# Patient Record
Sex: Male | Born: 1955 | Race: White | Hispanic: No | Marital: Single | Smoking: Never smoker
Health system: Southern US, Community
[De-identification: ages and names within clinical notes are randomized; demographics above are authoritative.]

## PROBLEM LIST (undated history)

## (undated) DIAGNOSIS — N472 Paraphimosis: Secondary | ICD-10-CM

## (undated) DIAGNOSIS — I10 Essential (primary) hypertension: Secondary | ICD-10-CM

## (undated) DIAGNOSIS — K219 Gastro-esophageal reflux disease without esophagitis: Secondary | ICD-10-CM

## (undated) DIAGNOSIS — R972 Elevated prostate specific antigen [PSA]: Secondary | ICD-10-CM

## (undated) DIAGNOSIS — N400 Enlarged prostate without lower urinary tract symptoms: Secondary | ICD-10-CM

## (undated) DIAGNOSIS — R339 Retention of urine, unspecified: Secondary | ICD-10-CM

## (undated) HISTORY — DX: Elevated prostate specific antigen (PSA): R97.20

## (undated) HISTORY — PX: OTHER SURGICAL HISTORY: SHX169

## (undated) HISTORY — DX: Gastro-esophageal reflux disease without esophagitis: K21.9

## (undated) HISTORY — DX: Benign prostatic hyperplasia without lower urinary tract symptoms: N40.0

## (undated) HISTORY — PX: COLONOSCOPY: SHX174

## (undated) HISTORY — DX: Paraphimosis: N47.2

## (undated) HISTORY — DX: Essential (primary) hypertension: I10

## (undated) HISTORY — PX: UPPER GI ENDOSCOPY: SHX6162

## (undated) HISTORY — DX: Retention of urine, unspecified: R33.9

---

## 1981-02-10 HISTORY — PX: WISDOM TOOTH EXTRACTION: SHX21

## 1999-07-09 ENCOUNTER — Emergency Department (HOSPITAL_COMMUNITY): Admission: EM | Admit: 1999-07-09 | Discharge: 1999-07-09 | Payer: Self-pay | Admitting: Emergency Medicine

## 2001-08-05 ENCOUNTER — Encounter: Payer: Self-pay | Admitting: Family Medicine

## 2001-08-05 ENCOUNTER — Encounter: Admission: RE | Admit: 2001-08-05 | Discharge: 2001-08-05 | Payer: Self-pay | Admitting: Family Medicine

## 2001-08-09 ENCOUNTER — Encounter: Payer: Self-pay | Admitting: Family Medicine

## 2001-08-09 ENCOUNTER — Encounter: Admission: RE | Admit: 2001-08-09 | Discharge: 2001-08-09 | Payer: Self-pay | Admitting: Family Medicine

## 2003-07-03 ENCOUNTER — Inpatient Hospital Stay (HOSPITAL_COMMUNITY): Admission: AD | Admit: 2003-07-03 | Discharge: 2003-07-04 | Payer: Self-pay | Admitting: Internal Medicine

## 2007-06-15 ENCOUNTER — Encounter: Admission: RE | Admit: 2007-06-15 | Discharge: 2007-06-15 | Payer: Self-pay | Admitting: Gastroenterology

## 2010-06-28 NOTE — Op Note (Signed)
NAMETABER, SWEETSER                         ACCOUNT NO.:  [id number]   MEDICAL RECORD NO.:  [id number]                   PATIENT TYPE:  INP   LOCATION:  6714                                 FACILITY:  [facility]   PHYSICIAN:  Abigail Miyamoto, M.D.              DATE OF BIRTH:  07/27/1955   DATE OF PROCEDURE:  07/03/2003  DATE OF DISCHARGE:                                 OPERATIVE REPORT   PREOPERATIVE DIAGNOSIS:  Left lower extremity abscess.   POSTOPERATIVE DIAGNOSIS:  Left lower extremity abscess.   PROCEDURE:  Incision and drainage of left lower leg abscess.   SURGEON:  Abigail Miyamoto, M.D.   ANESTHESIA:  Lidocaine 1%.   ESTIMATED BLOOD LOSS:  Minimal.   INDICATIONS FOR PROCEDURE:  The patient is a young gentleman with an abscess  on his left lower extremity that is pertinent, secondary to a spider bite.  The wound is draining purulent material, therefore an incision and drainage  has been recommended.   DESCRIPTION OF PROCEDURE:  The patient was already in the hospital room in  the bed in the supine position.  His left leg was then prepped and draped on  the lateral side under normal technique.  There was a large necrotic area of  skin measuring approximately 30 cm in diameter, and he had a large amount of  cellulitis around this.  At this point the skin was prepped with Betadine  and anesthetized with 1% Lidocaine.  An ellipse of skin was then excised,  removing the necrotic skin and underlying necrotic fat.  Further necrotic  debris in the wound was then excised likewise with a #11 blade.  The wound  was then thoroughly irrigated with normal saline.  The underlying muscle  appeared viable.  At this point the wound was packed with a wet to dry  saline gauze.  Dry gauze was then placed over top of this.  The patient tolerated the procedure well.  He was then left in his hospital  room.                                               Abigail Miyamoto, M.D.    DB/MEDQ   D:  07/03/2003  T:  07/04/2003  Job:  [id number]

## 2010-06-28 NOTE — Consult Note (Signed)
Calvin [name], Calvin [name]                         ACCOUNT NO.:  [id number]   MEDICAL RECORD NO.:  [id number]                   PATIENT TYPE:  INP   LOCATION:  6714                                 FACILITY:  [facility]   PHYSICIAN:  Abigail Miyamoto, M.D.              DATE OF BIRTH:  1955-10-07   DATE OF CONSULTATION:  07/03/2003  DATE OF DISCHARGE:  07/04/2003                                   CONSULTATION   CHIEF COMPLAINT:  Left leg abscess.   HISTORY OF PRESENT ILLNESS:  This very pleasant gentleman who works at the  [employer] who has no prior medical history presented last week to his  primary care physician with a reddened area on his left lower extremity.  It  has since progressed to develop into cellulitis and a draining abscess that  was I&D'd by his primary care physician.  He is being admitted to the  hospital fur further IV antibiotic __________.  I have been consulted to  follow the wound and perform further incision and drainage.  The patient  reports he is having some pain in his left lower extremity but is otherwise  without complaint.  He has no numbness or tingling in the foot.  Again he is  not a diabetic and has no previous past medical history.   ALLERGIES:  He is allergic to PENICILLIN.   HABITS:  He also does not smoke.   PHYSICAL EXAMINATION:  VITAL SIGNS:  The patient is currently afebrile.  His  vital signs are stable.  EXTREMITIES:  Physical examination was limited to the left lower extremity.  He has a large, approximately 3 cm necrotic area on the lateral aspect of  the left lower extremity below the knee.  There is surrounding induration  and a large amount of cellulitis and swelling.  The small wound in the  middle of the necrotic skin is draining purulent material.   IMPRESSION/PLAN:  This is a patient with a left lower extremity abscess.  At  this point incision and drainage will be performed at bedside.  We will then  start wet to dry normal saline  dressing changes.  I agree with IV  antibiotics and leg elevation and we will follow the wound with you  following my incision and drainage.                                               Abigail Miyamoto, M.D.    DB/MEDQ  D:  07/03/2003  T:  07/04/2003  Job:  [id number]

## 2010-06-28 NOTE — H&P (Signed)
Calvin [name], Calvin [name]                         ACCOUNT NO.:  [id number]   MEDICAL RECORD NO.:  [id number]                   PATIENT TYPE:  INP   LOCATION:  6714                                 FACILITY:  [facility]   PHYSICIAN:  Melissa L. Ladona Ridgel, MD               DATE OF BIRTH:  [birth year]   DATE OF ADMISSION:  07/03/2003  DATE OF DISCHARGE:                                HISTORY & PHYSICAL   PRIMARY CARE PHYSICIAN:  Vikki Ports, M.D.   CHIEF COMPLAINT:  Painful leg wound.   HISTORY OF PRESENT ILLNESS:  This is a 55 year old white male who awoke on  Tuesday of this week with a small red spot on his left leg.  He paid no  attention to this wound until Thursday when he struck his leg on an object  and noticed that it was increasingly painful and red.  He made an  appointment to see Dr. Chales Salmon. Thacker on Friday, and was started on  Levaquin.  The area around his initial cellulitis was marked with a pen.  By  the following day, the erythema had extended well beyond the margins of the  pen markings, and the wound itself opened up ventrally, expressing large  amounts of pus.  The patient reports associated fevers and chills earlier in  the week; however, at present denies any.  [facility], and the wound was further I&D'd, and  specimens were sent to the laboratory for evaluation.  The wound continued  to become worse overnight, and he is therefore being directly admitted to  the hospital for IV antibiotic therapy and further debridement.   REVIEW OF SYSTEMS:  As stated, initially fever and chills earlier in the  week.  At present no nausea, vomiting, diarrhea, fever, or chills.  No  cough, chest pain, abdominal pain, melena, or hematochezia.  No dysuria, no  fall.  Other review of systems negative.   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  He does  not smoke.  He does occasionally drink alcohol.  He  denies any IV drug use  or illicit drug use.   FAMILY HISTORY:  His mom is deceased, secondary to heart failure and lung  disease.  His father is living, but he is not aware of his medical  conditions.   ALLERGIES:  PENICILLIN which causes a rash.   CURRENT MEDICATIONS:  Levaquin, unknown dose at this time.  No other  medications.   PHYSICAL EXAMINATION:  VITAL SIGNS:  On admission temperature 98.4 degrees,  blood pressure 118/82, heart rate 66, respirations 18.  Saturations 99%.  GENERAL:  This is a well-nourished, well-developed white male, in no acute  distress.  HEENT:  He is normocephalic, atraumatic.  Pupils equal, round, reactive to  light.  Extraocular muscles intact.  Mucous membranes are moist.  NECK:  Supple.  No jugular venous distention.  No lymph nodes.  No bruits.  CHEST:  Is clear to auscultation with no rhonchi, rales, or wheezes.  CARDIOVASCULAR:  A regular rate and rhythm.  Positive S1, S2.  No S3, no S4.  No murmurs, rubs, or gallops.  ABDOMEN:  Soft, nontender, nondistended.  Positive bowel sounds.  No  hepatosplenomegaly.  EXTREMITIES:  A left leg wound with extensive cellulitis surrounding a 3.0  to 4.0 cm diameter lesion.  It shows purulence and some hemorrhage.  NEUROLOGIC:  He is nonfocal.  Power is 5/5.  Sensation is intact.  Deep  tendon reflexes 2+.   LABORATORY DATA:  Pending.   ASSESSMENT:  This is a 55 year old white male with a leg wound, which has  developed into an abscess.  In retrospect the patient was cleaning out his  shed on Monday, the day before the initial red spot appeared.  He states  that he may have been bitten by a brown recluse spider.   PLAN:  1. Infectious disease:  The patient will be started on vancomycin and     moxifloxacin, as he has a penicillin allergy.  Surgery has been contacted     to debride the wound and send cultures for further followup.  We will     also check blood cultures.  2. We will decide on placement of  the PIC line in the morning, depending     upon the findings on the laboratory work and his initial cultures which     had been sent on Sunday from the outpatient clinic.  3. We will obtain cultures from the [facility] that were completed     as an outpatient.  4. We will consider an infectious disease consultation, depending upon the     hospital course and the organism that we find is involved.  They will be     able to assist in determining whether home therapy needs to be IV or p.o.  5. GI:  The patient will be maintained on a regular diet.  I do not believe     he needs GI prophylaxis at this time.  6. Deep vein thrombosis prophylaxis:  The patient can ambulate as tolerated.     Should he show further signs of leg pain, however, investigation for a     deep vein thrombosis should be performed, as he probably has not been as     active as he would have been on this leg.  At this time there is no     suggestion for a deep vein thrombosis, and he is able to ambulate.                                                Melissa L. Ladona Ridgel, MD    MLT/MEDQ  D:  07/03/2003  T:  07/03/2003  Job:  [id number]   cc:   Vikki Ports, M.D.  [address]  [place]  [place] [address]  Fax: [phone]

## 2014-02-01 ENCOUNTER — Encounter: Payer: Self-pay | Admitting: Cardiology

## 2014-02-01 ENCOUNTER — Ambulatory Visit (INDEPENDENT_AMBULATORY_CARE_PROVIDER_SITE_OTHER): Payer: Federal, State, Local not specified - PPO | Admitting: Cardiology

## 2014-02-01 VITALS — BP 140/90 | HR 68 | Ht 72.0 in | Wt 204.7 lb

## 2014-02-01 DIAGNOSIS — I4891 Unspecified atrial fibrillation: Secondary | ICD-10-CM

## 2014-02-01 DIAGNOSIS — R5383 Other fatigue: Secondary | ICD-10-CM

## 2014-02-01 NOTE — Patient Instructions (Signed)
Your physician recommends that you schedule a follow-up appointment in: one year with Dr. Hochrein  

## 2014-02-01 NOTE — Progress Notes (Signed)
HPI The patient presents for evaluation and screening for possible heart disease. He has had no history of heart disease and doesn't report any overt cardiovascular symptoms. He has had some fatigue but it depends he says, he's been working. 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  He walks 2 hours several days a week and he has no symptoms related to this. The patient denies any new symptoms such as chest discomfort, neck or arm discomfort. There has been no new shortness of breath, PND or orthopnea. There have been no reported palpitations, presyncope or syncope.  Allergies  Allergen Reactions  . Penicillins Rash    Current Outpatient Prescriptions  Medication Sig Dispense Refill  . FLUVIRIN 0.5 ML SUSY   0   No current facility-administered medications for this visit.    Past Medical History  Diagnosis Date  . GERD (gastroesophageal reflux disease)     No past surgical history on file.  Family History  Problem Relation Age of Onset  . Heart failure Mother     History   Social History  . Marital Status: Single    Spouse Name: N/A    Number of Children: N/A  . Years of Education: N/A   Occupational History  . Not on file.   Social History Main Topics  . Smoking status: Never Smoker   . Smokeless tobacco: Not on file  . Alcohol Use: No  . Drug Use: No  . Sexual Activity: Not on file   Other Topics Concern  . Not on file   Social History Narrative   Tobacco use cigarettes Never smoked   Alcohol yes: rare  - 1week ago or so    Caffeine : yes    No recreational  Drug use    Occupation employed , Radiographer, [employer]   Marital Status : single   Children : none    ROS:  As stated in the HPI and negative for all other systems.  PHYSICAL EXAM BP 140/90 mmHg  Pulse 68  Ht 6' (1.829 m)  Wt 204 lb 11.2 oz (92.851 kg)  BMI 27.76 kg/m2  GENERAL:  Well appearing HEENT:  Pupils equal round and reactive, fundi not visualized, oral mucosa  unremarkable NECK:  No jugular venous distention, waveform within normal limits, carotid upstroke brisk and symmetric, no bruits, no thyromegaly LYMPHATICS:  No cervical, inguinal adenopathy LUNGS:  Clear to auscultation bilaterally BACK:  No CVA tenderness CHEST:  Unremarkable HEART:  PMI not displaced or sustained,S1 and S2 within normal limits, no S3, no S4, no clicks, no rubs, no murmurs ABD:  Flat, positive bowel sounds normal in frequency in pitch, no bruits, no rebound, no guarding, no midline pulsatile mass, no hepatomegaly, no splenomegaly EXT:  2 plus pulses throughout, no edema, no cyanosis no clubbing SKIN:  No rashes no nodules NEURO:  Cranial nerves II through XII grossly intact, motor grossly intact throughout PSYCH:  Cognitively intact, oriented to person place and time   EKG:   Sinus rhythm, rate 68, axis within normal limits, intervals within normal limits, no acute ST T wave changes.  02/01/2014   ASSESSMENT AND PLAN   FATIGUE:  I don't suspect a cardiac etiology. Patient has no significant risk factors and no findings on exam. His known history consistent with cardiac etiology. He exercises routinely. Given this the pretest probability of obstructive coronary disease or structural heart disease is low. No further cardiovascular testing is suggested.  HTN:  His blood  pressure is borderline. However, he does not routinely elevated. He can keep an eye on this. No change in therapy is planned.

## 2014-10-06 ENCOUNTER — Other Ambulatory Visit: Payer: Self-pay | Admitting: Family Medicine

## 2014-10-06 ENCOUNTER — Ambulatory Visit
Admission: RE | Admit: 2014-10-06 | Discharge: 2014-10-06 | Disposition: A | Discharge status: Home / Self Care | Payer: Federal, State, Local not specified - PPO | Source: Ambulatory Visit | Attending: Family Medicine | Admitting: Family Medicine

## 2014-10-06 DIAGNOSIS — M25572 Pain in left ankle and joints of left foot: Secondary | ICD-10-CM

## 2016-08-19 DIAGNOSIS — R310 Gross hematuria: Secondary | ICD-10-CM | POA: Diagnosis not present

## 2016-08-19 DIAGNOSIS — R350 Frequency of micturition: Secondary | ICD-10-CM | POA: Diagnosis not present

## 2016-08-19 DIAGNOSIS — N401 Enlarged prostate with lower urinary tract symptoms: Secondary | ICD-10-CM | POA: Diagnosis not present

## 2016-08-20 DIAGNOSIS — N281 Cyst of kidney, acquired: Secondary | ICD-10-CM | POA: Diagnosis not present

## 2016-08-20 DIAGNOSIS — R310 Gross hematuria: Secondary | ICD-10-CM | POA: Diagnosis not present

## 2016-09-08 DIAGNOSIS — R351 Nocturia: Secondary | ICD-10-CM | POA: Diagnosis not present

## 2016-09-08 DIAGNOSIS — R972 Elevated prostate specific antigen [PSA]: Secondary | ICD-10-CM | POA: Diagnosis not present

## 2016-09-08 DIAGNOSIS — N401 Enlarged prostate with lower urinary tract symptoms: Secondary | ICD-10-CM | POA: Diagnosis not present

## 2016-09-08 DIAGNOSIS — R310 Gross hematuria: Secondary | ICD-10-CM | POA: Diagnosis not present

## 2016-12-09 DIAGNOSIS — R972 Elevated prostate specific antigen [PSA]: Secondary | ICD-10-CM | POA: Diagnosis not present

## 2016-12-17 DIAGNOSIS — R310 Gross hematuria: Secondary | ICD-10-CM | POA: Diagnosis not present

## 2016-12-17 DIAGNOSIS — R972 Elevated prostate specific antigen [PSA]: Secondary | ICD-10-CM | POA: Diagnosis not present

## 2017-02-26 DIAGNOSIS — R030 Elevated blood-pressure reading, without diagnosis of hypertension: Secondary | ICD-10-CM | POA: Diagnosis not present

## 2017-02-26 DIAGNOSIS — J209 Acute bronchitis, unspecified: Secondary | ICD-10-CM | POA: Diagnosis not present

## 2017-02-26 DIAGNOSIS — R0989 Other specified symptoms and signs involving the circulatory and respiratory systems: Secondary | ICD-10-CM | POA: Diagnosis not present

## 2017-02-28 ENCOUNTER — Other Ambulatory Visit: Payer: Self-pay | Admitting: Family Medicine

## 2017-02-28 DIAGNOSIS — R9389 Abnormal findings on diagnostic imaging of other specified body structures: Secondary | ICD-10-CM

## 2017-03-06 ENCOUNTER — Ambulatory Visit
Admission: RE | Admit: 2017-03-06 | Discharge: 2017-03-06 | Disposition: A | Discharge status: Home / Self Care | Payer: Federal, State, Local not specified - PPO | Source: Ambulatory Visit | Attending: Family Medicine | Admitting: Family Medicine

## 2017-03-06 DIAGNOSIS — R918 Other nonspecific abnormal finding of lung field: Secondary | ICD-10-CM | POA: Diagnosis not present

## 2017-03-06 DIAGNOSIS — R9389 Abnormal findings on diagnostic imaging of other specified body structures: Secondary | ICD-10-CM

## 2017-03-06 MED ORDER — IOPAMIDOL (ISOVUE-300) INJECTION 61%
75.0000 mL | Freq: Once | INTRAVENOUS | Status: AC | PRN
  Administered 2017-03-06: 75 mL via INTRAVENOUS

## 2017-03-13 DIAGNOSIS — R0989 Other specified symptoms and signs involving the circulatory and respiratory systems: Secondary | ICD-10-CM | POA: Diagnosis not present

## 2017-03-13 DIAGNOSIS — J209 Acute bronchitis, unspecified: Secondary | ICD-10-CM | POA: Diagnosis not present

## 2017-03-13 DIAGNOSIS — R030 Elevated blood-pressure reading, without diagnosis of hypertension: Secondary | ICD-10-CM | POA: Diagnosis not present

## 2017-06-09 DIAGNOSIS — Z13220 Encounter for screening for lipoid disorders: Secondary | ICD-10-CM | POA: Diagnosis not present

## 2017-06-09 DIAGNOSIS — Z0000 Encounter for general adult medical examination without abnormal findings: Secondary | ICD-10-CM | POA: Diagnosis not present

## 2017-06-10 DIAGNOSIS — R972 Elevated prostate specific antigen [PSA]: Secondary | ICD-10-CM | POA: Diagnosis not present

## 2017-06-17 DIAGNOSIS — N400 Enlarged prostate without lower urinary tract symptoms: Secondary | ICD-10-CM | POA: Diagnosis not present

## 2017-06-17 DIAGNOSIS — R972 Elevated prostate specific antigen [PSA]: Secondary | ICD-10-CM | POA: Diagnosis not present

## 2017-06-17 DIAGNOSIS — R310 Gross hematuria: Secondary | ICD-10-CM | POA: Diagnosis not present

## 2017-07-16 DIAGNOSIS — R6883 Chills (without fever): Secondary | ICD-10-CM | POA: Diagnosis not present

## 2017-07-16 DIAGNOSIS — J209 Acute bronchitis, unspecified: Secondary | ICD-10-CM | POA: Diagnosis not present

## 2017-07-30 DIAGNOSIS — R05 Cough: Secondary | ICD-10-CM | POA: Diagnosis not present

## 2017-07-30 DIAGNOSIS — R0989 Other specified symptoms and signs involving the circulatory and respiratory systems: Secondary | ICD-10-CM | POA: Diagnosis not present

## 2017-08-12 DIAGNOSIS — R05 Cough: Secondary | ICD-10-CM | POA: Diagnosis not present

## 2017-08-12 DIAGNOSIS — R062 Wheezing: Secondary | ICD-10-CM | POA: Diagnosis not present

## 2017-08-12 DIAGNOSIS — K219 Gastro-esophageal reflux disease without esophagitis: Secondary | ICD-10-CM | POA: Diagnosis not present

## 2018-09-17 IMAGING — CT CT CHEST W/ CM
2 of 3 series · 16 of 30 positions shown, 19 images · IV contrast (75CC ISOVUE 300)
Comparison: Radiographs dated 12/27/2017

CLINICAL DATA: Abnormal chest x-ray dated 02/26/2017. Sclerotic
right sixth rib.

Creatinine was obtained on site at [HOSPITAL] at [REDACTED].
Results: Creatinine 0.9 mg/dL.
EXAM:
CT CHEST WITH CONTRAST
TECHNIQUE: Multidetector CT imaging of the chest was performed during
intravenous contrast administration.
CONTRAST:  75mL 5Z5QPD-Q33 IOPAMIDOL (5Z5QPD-Q33) INJECTION 61%

[Series 3: chest with · axial · 0.77mm/px · z∈[-222,+26]mm · 10 of 123 slices shown, 13 images]
[im 12/123  mediastinal]
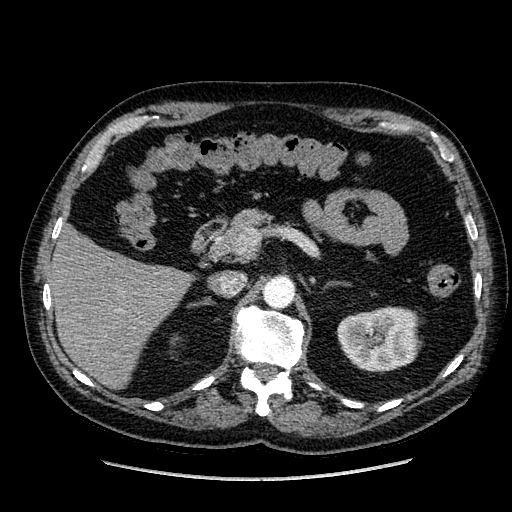
[im 12/123  lung]
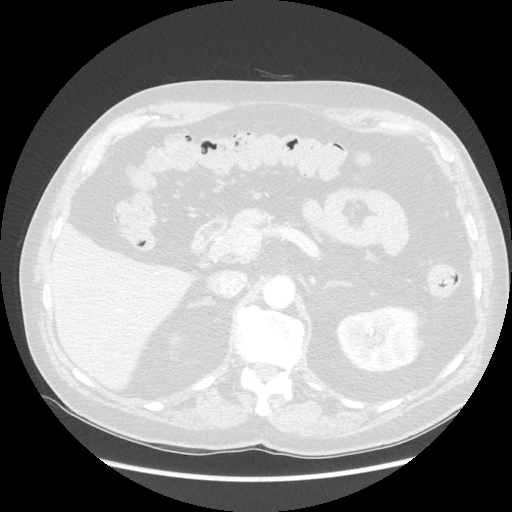
[im 23/123  lung]
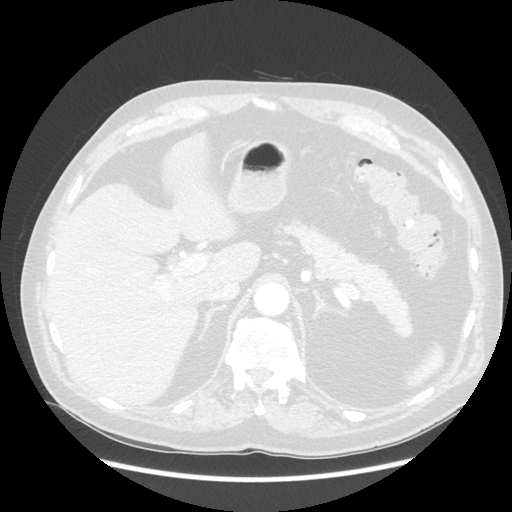
[im 34/123  lung]
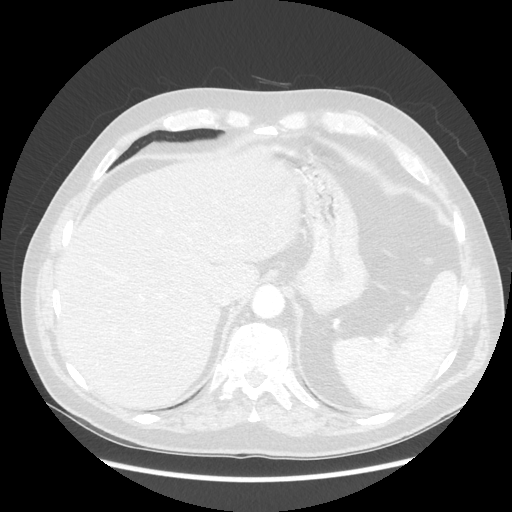
[im 45/123  lung]
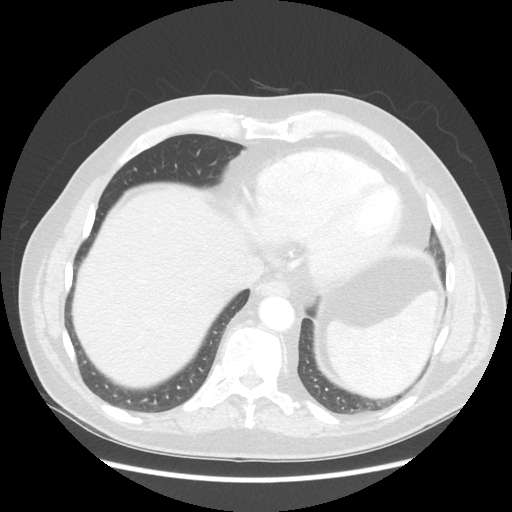
[im 56/123  mediastinal]
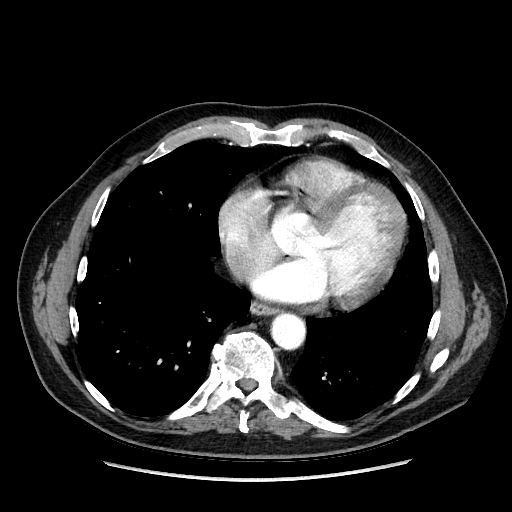
[im 56/123  lung]
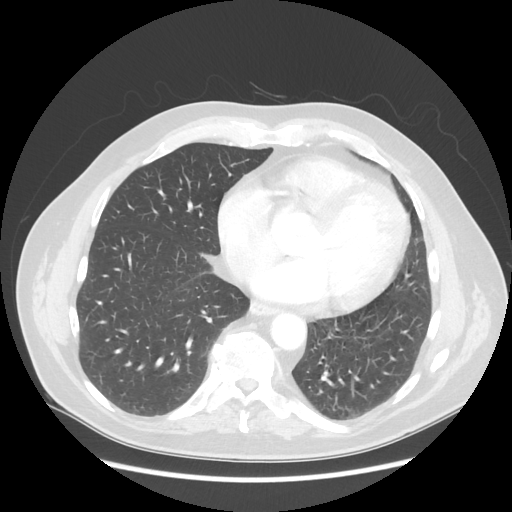
[im 67/123  lung]
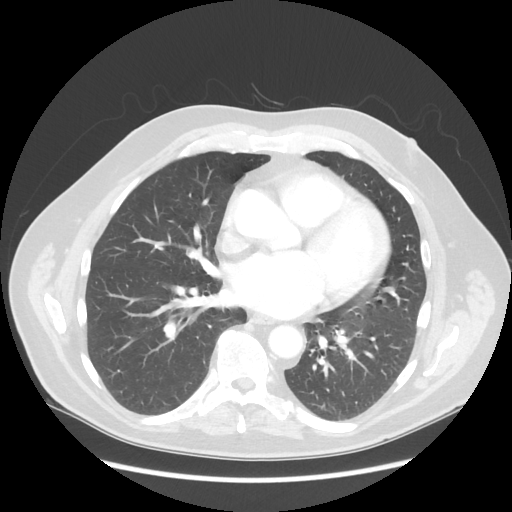
[im 78/123  lung]
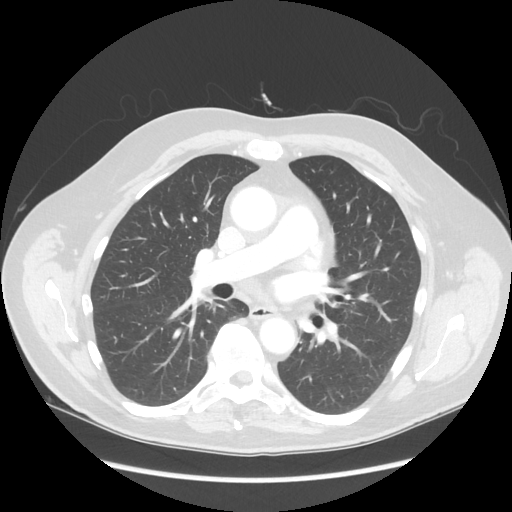
[im 89/123  lung]
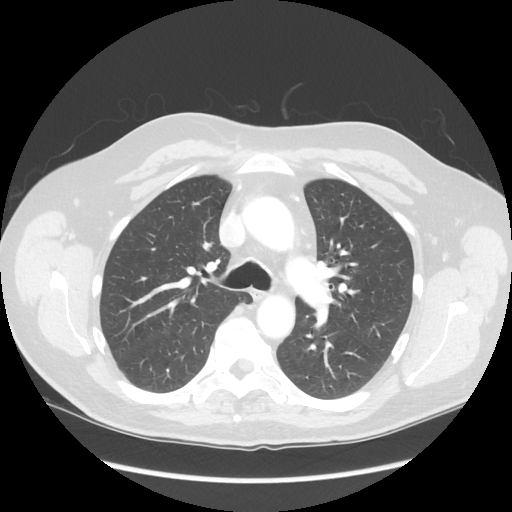
[im 100/123  mediastinal]
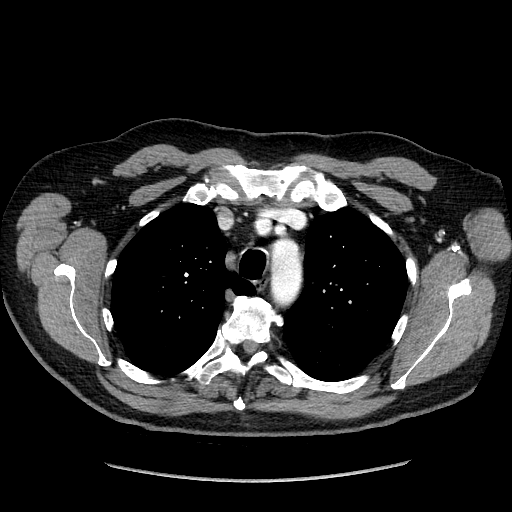
[im 100/123  lung]
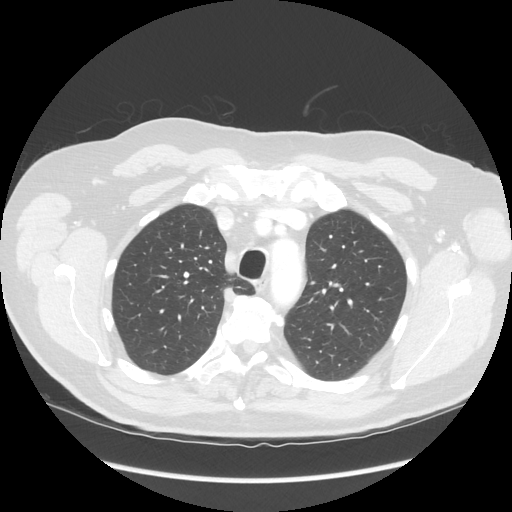
[im 111/123  lung]
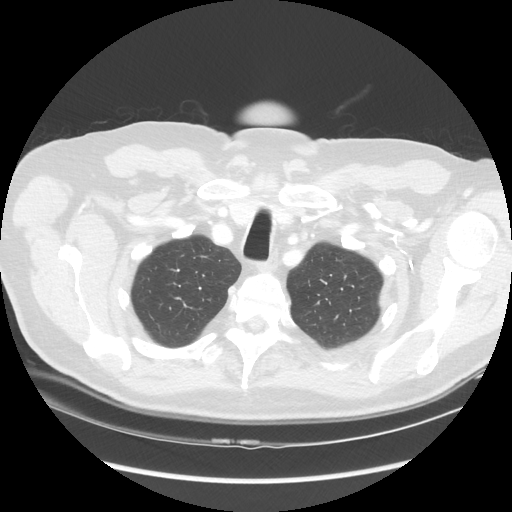

[Series 602: sagittal body · sagittal · 0.77mm/px · 6 of 158 slices shown]
[im 11/158  mediastinal]
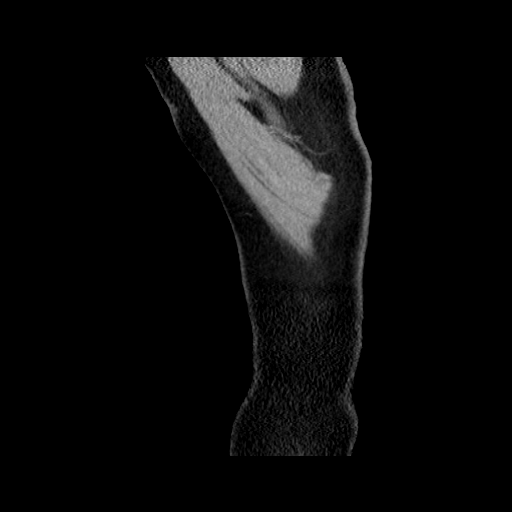
[im 32/158  mediastinal]
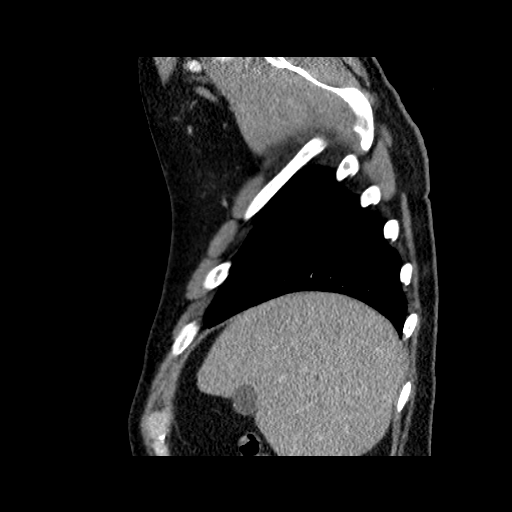
[im 53/158  mediastinal]
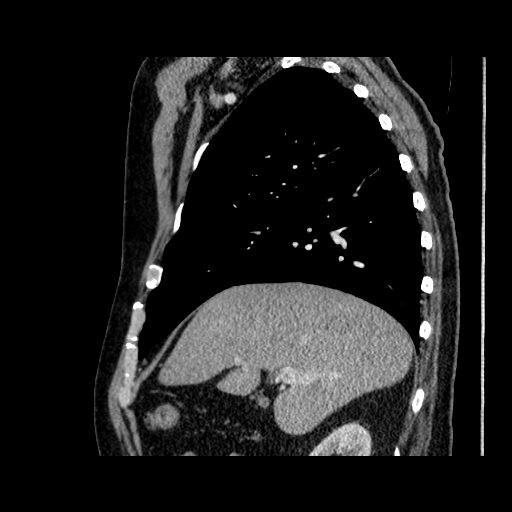
[im 74/158  mediastinal]
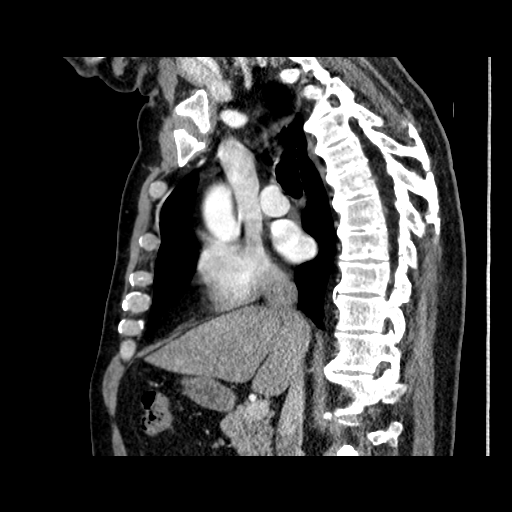
[im 84/158  mediastinal]
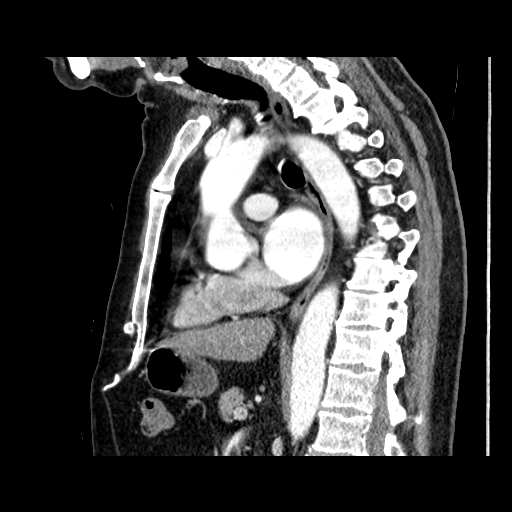
[im 105/158  mediastinal]
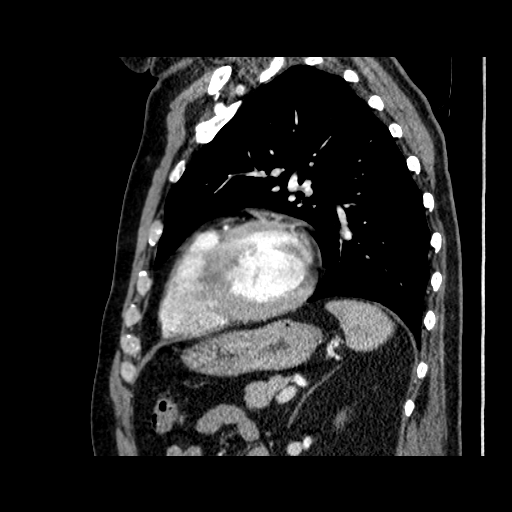

[16 of 30 positions shown; findings below may reference images not displayed]

FINDINGS: Cardiovascular: No significant vascular findings. Normal heart size.
No pericardial effusion.

Mediastinum/Nodes: No enlarged mediastinal, hilar, or axillary lymph
nodes. Thyroid gland, trachea, and esophagus demonstrate no
significant findings.

Lungs/Pleura: Lungs are clear. No pleural effusion or pneumothorax.

Upper Abdomen: Normal except for an 18 mm exophytic cyst on the
posterior aspect of the mid left kidney.

Musculoskeletal: There is a subtle area minimal sclerosis of the
lateral aspect of the right sixth rib which correlates with the
appearance on chest x-ray. There is a similar appearance of the
opposite sixth rib. This is felt to be developmental rather than
representing a significant sclerotic bone lesion.

Small osteophytes fuse much of the thoracic spine. Mild thoracic
scoliosis.
IMPRESSION: No significant abnormalities. The slight sclerotic appearance of the
anterolateral aspect of the right sixth rib is felt to be a benign
developmental abnormality. The left sixth rib has a similar
appearance.

## 2019-04-10 ENCOUNTER — Ambulatory Visit: Payer: Federal, State, Local not specified - PPO | Attending: Internal Medicine

## 2019-04-10 DIAGNOSIS — Z23 Encounter for immunization: Secondary | ICD-10-CM

## 2019-04-10 NOTE — Progress Notes (Signed)
   Covid-19 Vaccination Clinic  Name:  Calvin [name]    MRN: [id number] DOB: September 08, 1955  04/10/2019  Mr. [name] was observed post Covid-19 immunization for 15 minutes without incidence. He was provided with Vaccine Information Sheet and instruction to access the V-Safe system.   Mr. [name] was instructed to call 911 with any severe reactions post vaccine: Marland Kitchen Difficulty breathing  . Swelling of your face and throat  . A fast heartbeat  . A bad rash all over your body  . Dizziness and weakness    Immunizations Administered    Name Date Dose VIS Date Route   Pfizer COVID-19 Vaccine 04/10/2019  8:34 AM 0.3 mL 01/21/2019 Intramuscular   Manufacturer: [employer], [employer]   Lot: [id number]   NDC: [address]-2

## 2019-05-04 ENCOUNTER — Ambulatory Visit: Payer: Federal, State, Local not specified - PPO | Attending: Internal Medicine

## 2019-05-04 DIAGNOSIS — Z23 Encounter for immunization: Secondary | ICD-10-CM

## 2019-05-04 NOTE — Progress Notes (Signed)
   Covid-19 Vaccination Clinic  Name:  Calvin [name]    MRN: [id number] DOB: 01-Jul-1955  05/04/2019  [name] was observed post Covid-19 immunization for 15 minutes without incident. He was provided with Vaccine Information Sheet and instruction to access the V-Safe system.   [name] was instructed to call 911 with any severe reactions post vaccine: Marland Kitchen Difficulty breathing  . Swelling of face and throat  . A fast heartbeat  . A bad rash all over body  . Dizziness and weakness   Immunizations Administered    Name Date Dose VIS Date Route   Pfizer COVID-19 Vaccine 05/04/2019  9:03 AM 0.3 mL 01/21/2019 Intramuscular   Manufacturer: [employer], [employer]   Lot: [id number]   NDC: [address]-1

## 2021-02-10 HISTORY — PX: PROSTATE SURGERY: SHX751

## 2021-06-14 HISTORY — PX: CYSTOSCOPY: SUR368

## 2021-07-11 DIAGNOSIS — N401 Enlarged prostate with lower urinary tract symptoms: Secondary | ICD-10-CM

## 2021-07-11 DIAGNOSIS — N138 Other obstructive and reflux uropathy: Secondary | ICD-10-CM

## 2021-07-11 HISTORY — DX: Other obstructive and reflux uropathy: N13.8

## 2021-07-11 HISTORY — DX: Benign prostatic hyperplasia with lower urinary tract symptoms: N40.1

## 2021-07-24 ENCOUNTER — Encounter: Payer: Self-pay | Admitting: *Deleted

## 2021-07-30 ENCOUNTER — Other Ambulatory Visit
Admission: RE | Admit: 2021-07-30 | Discharge: 2021-07-30 | Disposition: A | Discharge status: Home / Self Care | Payer: Medicare Other | Source: Ambulatory Visit | Attending: Urology | Admitting: Urology

## 2021-07-30 ENCOUNTER — Encounter: Payer: Self-pay | Admitting: Urology

## 2021-07-30 ENCOUNTER — Other Ambulatory Visit: Payer: Self-pay | Admitting: Urology

## 2021-07-30 ENCOUNTER — Ambulatory Visit: Payer: Medicare Other | Admitting: Urology

## 2021-07-30 ENCOUNTER — Telehealth: Payer: Self-pay

## 2021-07-30 VITALS — BP 155/97 | HR 108 | Ht 72.0 in | Wt 199.4 lb

## 2021-07-30 DIAGNOSIS — Z01818 Encounter for other preprocedural examination: Secondary | ICD-10-CM | POA: Diagnosis present

## 2021-07-30 DIAGNOSIS — N138 Other obstructive and reflux uropathy: Secondary | ICD-10-CM

## 2021-07-30 DIAGNOSIS — N401 Enlarged prostate with lower urinary tract symptoms: Secondary | ICD-10-CM

## 2021-07-30 LAB — URINALYSIS, COMPLETE (UACMP) WITH MICROSCOPIC
Bilirubin Urine: NEGATIVE
Glucose, UA: NEGATIVE mg/dL
Ketones, ur: NEGATIVE mg/dL
Nitrite: POSITIVE — AB
Protein, ur: 100 mg/dL — AB
Specific Gravity, Urine: 1.025 (ref 1.005–1.030)
Squamous Epithelial / LPF: NONE SEEN (ref 0–5)
pH: 5 (ref 5.0–8.0)

## 2021-07-30 NOTE — Progress Notes (Signed)
   07/30/21 1:13 PM   Calvin [name] 11/06/1955 [id number]  CC: BPH and urinary retention  HPI: 66-year-old male with worsening urinary symptoms and ultimately urinary retention referred to consider HOLEP.  He is a relatively healthy 66-year-old male with a history of gross hematuria as well as elevated PSA and prior negative biopsy.  He has had a indwelling Foley catheter since February 2023, which has been managed by Dr. Dahlstedt, and he has failed multiple voiding trials despite maximal medical therapy with Flomax and finasteride.  Recent transrectal ultrasound showed a prostate volume of 122 g, cystoscopy showed a large and friable prostate with no suspicious bladder lesions.   PMH: Past Medical History:  Diagnosis Date   BPH (benign prostatic hyperplasia)    Elevated PSA    GERD (gastroesophageal reflux disease)    Paraphimosis    Urinary retention     Family History: Family History  Problem Relation Age of Onset   Heart failure Mother     Social History:  reports that he has never smoked. He does not have any smokeless tobacco history on file. He reports that he does not drink alcohol and does not use drugs.  Physical Exam: BP (!) 155/97 (BP Location: Left Arm, Patient Position: Sitting, Cuff Size: Large)   Pulse (!) 108   Ht 6' (1.829 m)   Wt 199 lb 6.4 oz (90.4 kg)   BMI 27.04 kg/m    Constitutional:  Alert and oriented, No acute distress. Cardiovascular: No clubbing, cyanosis, or edema. Respiratory: Normal respiratory effort, no increased work of breathing. GI: Abdomen is soft, nontender, nondistended, no abdominal masses   Laboratory Data: Urinalysis and culture sent today   Assessment & Plan:   66-year-old male with BPH and Foley dependent urinary retention, prostate measures 120 g, no evidence of stricture or other bladder pathology on recent cystoscopy with Dr. Dahlstedt.  History of elevated PSA with negative biopsy, PSA most likely elevated secondary  to significant BPH.  We reviewed options including chronic Foley, CIC, or outlet procedure like HOLEP.  We focused primarily on HOLEP as the best option for long-term management of his BPH.  We discussed the risks and benefits of HoLEP at length.  The procedure requires general anesthesia and takes 1 to 2 hours, and a holmium laser is used to enucleate the prostate and push this tissue into the bladder.  A morcellator is then used to remove this tissue, which is sent for pathology.  The vast majority(>95%) of patients are able to discharge the same day with a catheter in place for 2 to 3 days, and will follow-up in clinic for a voiding trial.  We specifically discussed the risks of bleeding, infection, retrograde ejaculation, temporary urgency and urge incontinence, very low risk of long-term incontinence, urethral stricture/bladder neck contracture, pathologic evaluation of prostate tissue and possible detection of prostate cancer or other malignancy, and possible need for additional procedures.  Urinalysis and culture sent today Schedule HOLEP   Eveleigh Crumpler, MD 07/30/2021  [facility] [address], [address] , [place] [address] [phone]   

## 2021-07-30 NOTE — Telephone Encounter (Signed)
I spoke with Mr. Lehenbauer and his Sister Gayla Doss. We have discussed possible surgery dates and Friday June 30th, 2023 was agreed upon by all parties. Patient given information about surgery date, what to expect pre-operatively and post operatively.  We discussed that a Pre-Admission Testing office will be calling to set up the pre-op visit that will take place prior to surgery, and that these appointments are typically done over the phone with a Pre-Admissions RN.  Informed patient that our office will communicate any additional care to be provided after surgery. Patients questions or concerns were discussed during our call. Advised to call our office should there be any additional information, questions or concerns that arise. Patient verbalized understanding.

## 2021-07-30 NOTE — Progress Notes (Signed)
[facility] Posting Form   Surgery Date/Time: Date: 08/09/2021  Surgeon: Dr. Legrand Rams, MD  Surgery Location: Day Surgery  Inpt ( No  )   Outpt (Yes)   Obs ( No  )   Diagnosis: N40.1, N13.8 Benign Prostatic Hyperplasia with Urinary Obstruction  -CPT: 31497  Surgery: Holmium Laser Enucleation of the Prostate  Stop Anticoagulations: Yes and hold ASA  Cardiac/Medical/Pulmonary Clearance needed: no  *Orders entered into EPIC  Date: 07/30/21   *Case booked in [place]  Date: 07/30/21  *Notified pt of Surgery: Date: 07/30/21  PRE-OP UA & CX: Yes, obtained in clinic today  *Placed into Prior Authorization Work Que Date: 07/30/21   Assistant/laser/rep:No

## 2021-07-30 NOTE — H&P (View-Only) (Signed)
   07/30/21 1:13 PM   Calvin [name] 01/01/1956 [id number]  CC: BPH and urinary retention  HPI: 66 year old male with worsening urinary symptoms and ultimately urinary retention referred to consider HOLEP.  He is a relatively healthy 66 year old male with a history of gross hematuria as well as elevated PSA and prior negative biopsy.  He has had a indwelling Foley catheter since February 2023, which has been managed by Dr. Retta Diones, and he has failed multiple voiding trials despite maximal medical therapy with Flomax and finasteride.  Recent transrectal ultrasound showed a prostate volume of 122 g, cystoscopy showed a large and friable prostate with no suspicious bladder lesions.   PMH: Past Medical History:  Diagnosis Date   BPH (benign prostatic hyperplasia)    Elevated PSA    GERD (gastroesophageal reflux disease)    Paraphimosis    Urinary retention     Family History: Family History  Problem Relation Age of Onset   Heart failure Mother     Social History:  reports that he has never smoked. He does not have any smokeless tobacco history on file. He reports that he does not drink alcohol and does not use drugs.  Physical Exam: BP (!) 155/97 (BP Location: Left Arm, Patient Position: Sitting, Cuff Size: Large)   Pulse (!) 108   Ht 6' (1.829 m)   Wt 199 lb 6.4 oz (90.4 kg)   BMI 27.04 kg/m    Constitutional:  Alert and oriented, No acute distress. Cardiovascular: No clubbing, cyanosis, or edema. Respiratory: Normal respiratory effort, no increased work of breathing. GI: Abdomen is soft, nontender, nondistended, no abdominal masses   Laboratory Data: Urinalysis and culture sent today   Assessment & Plan:   66 year old male with BPH and Foley dependent urinary retention, prostate measures 120 g, no evidence of stricture or other bladder pathology on recent cystoscopy with Dr. Retta Diones.  History of elevated PSA with negative biopsy, PSA most likely elevated secondary  to significant BPH.  We reviewed options including chronic Foley, CIC, or outlet procedure like HOLEP.  We focused primarily on HOLEP as the best option for long-term management of his BPH.  We discussed the risks and benefits of HoLEP at length.  The procedure requires general anesthesia and takes 1 to 2 hours, and a holmium laser is used to enucleate the prostate and push this tissue into the bladder.  A morcellator is then used to remove this tissue, which is sent for pathology.  The vast majority(>95%) of patients are able to discharge the same day with a catheter in place for 2 to 3 days, and will follow-up in clinic for a voiding trial.  We specifically discussed the risks of bleeding, infection, retrograde ejaculation, temporary urgency and urge incontinence, very low risk of long-term incontinence, urethral stricture/bladder neck contracture, pathologic evaluation of prostate tissue and possible detection of prostate cancer or other malignancy, and possible need for additional procedures.  Urinalysis and culture sent today Schedule HOLEP   Legrand Rams, MD 07/30/2021  [facility] [address] [place], [place] [address] [phone]

## 2021-07-30 NOTE — Progress Notes (Signed)
Surgical Physician Order Form [facility] [place]  * Scheduling expectation : Next Available (patient has catheter and desires 6/30)  *Length of Case: 2 hours  *Clearance needed: no  *Anticoagulation Instructions: Hold all anticoagulants  *Aspirin Instructions: Hold Aspirin  *Post-op visit Date/Instructions:  1-3 day voiding trial, 3 months MD PVR  *Diagnosis: BPH w/urinary obstruction  *Procedure:     HOLEP (01027)   Additional orders: N/A  -Admit type: OUTpatient  -Anesthesia: General  -VTE Prophylaxis Standing Order SCD's       Other:   -Standing Lab Orders Per Anesthesia    Lab other: UA&Urine Culture (sent 6/20)  -Standing Test orders EKG/Chest x-ray per Anesthesia       Test other:   - Medications:  Cipro 400mg  IV  -Other orders:  N/A

## 2021-07-30 NOTE — Patient Instructions (Signed)

## 2021-07-31 LAB — URINE CULTURE

## 2021-08-01 ENCOUNTER — Telehealth: Payer: Self-pay

## 2021-08-01 LAB — URINE CULTURE: Culture: >=100000 — AB

## 2021-08-01 MED ORDER — SULFAMETHOXAZOLE-TRIMETHOPRIM 800-160 MG PO TABS: 1.0000 | ORAL_TABLET | Freq: Two times a day (BID) | ORAL | 0 refills | Status: DC

## 2021-08-01 NOTE — Telephone Encounter (Signed)
-----   Message from Sondra Come, MD sent at 08/01/2021  8:18 AM EDT ----- Please start Bactrim DS twice daily x7 days to sterilize urine prior to upcoming HOLEP surgery  Legrand Rams, MD 08/01/2021

## 2021-08-01 NOTE — Telephone Encounter (Signed)
-----   Message from Sondra Come, MD sent at 07/31/2021  7:59 AM EDT ----- Please start Bactrim DS twice daily x7 days to sterilize urine prior to surgery, will call if antibiotics need to be adjusted pending culture  Legrand Rams, MD 07/31/2021

## 2021-08-01 NOTE — Telephone Encounter (Signed)
Spoke with pt. Pt. Advised and verbalized understanding. Medication sent in to pharmacy in [place] per patient request.

## 2021-08-01 NOTE — Telephone Encounter (Signed)
Called pt. No answer. LM for pt informing him of the information below per DPR. RX previously sent.

## 2021-08-02 ENCOUNTER — Encounter
Admission: RE | Admit: 2021-08-02 | Discharge: 2021-08-02 | Disposition: A | Discharge status: Home / Self Care | Payer: Medicare Other | Source: Ambulatory Visit | Attending: Urology | Admitting: Urology

## 2021-08-02 ENCOUNTER — Encounter: Payer: Self-pay | Admitting: Urgent Care

## 2021-08-02 VITALS — Ht 72.0 in | Wt 185.0 lb

## 2021-08-02 DIAGNOSIS — Z01818 Encounter for other preprocedural examination: Secondary | ICD-10-CM | POA: Insufficient documentation

## 2021-08-02 DIAGNOSIS — Z01812 Encounter for preprocedural laboratory examination: Secondary | ICD-10-CM

## 2021-08-02 LAB — BASIC METABOLIC PANEL
Anion gap: 6 (ref 5–15)
BUN: 12 mg/dL (ref 8–23)
CO2: 26 mmol/L (ref 22–32)
Calcium: 9.3 mg/dL (ref 8.9–10.3)
Chloride: 108 mmol/L (ref 98–111)
Creatinine, Ser: 0.9 mg/dL (ref 0.61–1.24)
GFR, Estimated: >60 mL/min (ref >60)
Glucose, Bld: 103 mg/dL — ABNORMAL HIGH (ref 70–99)
Potassium: 3.8 mmol/L (ref 3.5–5.1)
Sodium: 140 mmol/L (ref 135–145)

## 2021-08-02 LAB — CBC
HCT: 41.9 % (ref 39.0–52.0)
Hemoglobin: 13.8 g/dL (ref 13.0–17.0)
MCH: 29.1 pg (ref 26.0–34.0)
MCHC: 32.9 g/dL (ref 30.0–36.0)
MCV: 88.2 fL (ref 80.0–100.0)
Platelets: 385 10*3/uL (ref 150–400)
RBC: 4.75 MIL/uL (ref 4.22–5.81)
RDW: 12.5 % (ref 11.5–15.5)
WBC: 8 10*3/uL (ref 4.0–10.5)
nRBC: 0 % (ref 0.0–0.2)

## 2021-08-12 ENCOUNTER — Ambulatory Visit: Payer: Federal, State, Local not specified - PPO | Admitting: Physician Assistant

## 2021-08-19 ENCOUNTER — Ambulatory Visit: Payer: Federal, State, Local not specified - PPO | Admitting: Physician Assistant

## 2021-08-23 ENCOUNTER — Ambulatory Visit
Admission: RE | Admit: 2021-08-23 | Discharge: 2021-08-23 | Disposition: A | Discharge status: Home / Self Care | Payer: Medicare Other | Attending: Urology | Admitting: Urology

## 2021-08-23 ENCOUNTER — Ambulatory Visit: Payer: Medicare Other | Admitting: General Practice

## 2021-08-23 ENCOUNTER — Encounter: Payer: Self-pay | Admitting: Urology

## 2021-08-23 ENCOUNTER — Other Ambulatory Visit: Payer: Self-pay

## 2021-08-23 ENCOUNTER — Encounter
Admission: RE | Disposition: A | Discharge status: Home / Self Care | Payer: Self-pay | Source: Home / Self Care | Attending: Urology

## 2021-08-23 DIAGNOSIS — N138 Other obstructive and reflux uropathy: Secondary | ICD-10-CM | POA: Diagnosis not present

## 2021-08-23 DIAGNOSIS — R338 Other retention of urine: Secondary | ICD-10-CM | POA: Insufficient documentation

## 2021-08-23 DIAGNOSIS — R339 Retention of urine, unspecified: Secondary | ICD-10-CM | POA: Diagnosis not present

## 2021-08-23 DIAGNOSIS — N401 Enlarged prostate with lower urinary tract symptoms: Secondary | ICD-10-CM | POA: Diagnosis present

## 2021-08-23 HISTORY — PX: HOLEP-LASER ENUCLEATION OF THE PROSTATE WITH MORCELLATION: SHX6641

## 2021-08-23 SURGERY — ENUCLEATION, PROSTATE, USING LASER, WITH MORCELLATION
Anesthesia: General

## 2021-08-23 MED ORDER — FENTANYL CITRATE (PF) 100 MCG/2ML IJ SOLN
INTRAMUSCULAR | Status: AC
  Filled 2021-08-23: qty 2

## 2021-08-23 MED ORDER — SUGAMMADEX SODIUM 200 MG/2ML IV SOLN
INTRAVENOUS | Status: DC | PRN
  Administered 2021-08-23: 200 mg via INTRAVENOUS

## 2021-08-23 MED ORDER — ROCURONIUM BROMIDE 100 MG/10ML IV SOLN
INTRAVENOUS | Status: DC | PRN
  Administered 2021-08-23: 70 mg via INTRAVENOUS
  Administered 2021-08-23: 20 mg via INTRAVENOUS

## 2021-08-23 MED ORDER — ROCURONIUM BROMIDE 10 MG/ML (PF) SYRINGE
PREFILLED_SYRINGE | INTRAVENOUS | Status: AC
  Filled 2021-08-23: qty 10

## 2021-08-23 MED ORDER — ORAL CARE MOUTH RINSE: 15.0000 mL | Freq: Once | OROMUCOSAL | Status: AC

## 2021-08-23 MED ORDER — PHENYLEPHRINE 80 MCG/ML (10ML) SYRINGE FOR IV PUSH (FOR BLOOD PRESSURE SUPPORT)
PREFILLED_SYRINGE | INTRAVENOUS | Status: DC | PRN
  Administered 2021-08-23 (×2): 80 ug via INTRAVENOUS

## 2021-08-23 MED ORDER — FENTANYL CITRATE (PF) 100 MCG/2ML IJ SOLN: 25.0000 ug | INTRAMUSCULAR | Status: DC | PRN

## 2021-08-23 MED ORDER — LACTATED RINGERS IV SOLN: INTRAVENOUS | Status: DC

## 2021-08-23 MED ORDER — DEXAMETHASONE SODIUM PHOSPHATE 10 MG/ML IJ SOLN
INTRAMUSCULAR | Status: DC | PRN
  Administered 2021-08-23: 10 mg via INTRAVENOUS

## 2021-08-23 MED ORDER — ONDANSETRON HCL 4 MG/2ML IJ SOLN
INTRAMUSCULAR | Status: AC
  Filled 2021-08-23: qty 2

## 2021-08-23 MED ORDER — FAMOTIDINE 20 MG PO TABS
ORAL_TABLET | ORAL | Status: AC
  Administered 2021-08-23: 20 mg via ORAL
  Filled 2021-08-23: qty 1

## 2021-08-23 MED ORDER — FENTANYL CITRATE (PF) 100 MCG/2ML IJ SOLN
INTRAMUSCULAR | Status: DC | PRN
  Administered 2021-08-23 (×4): 50 ug via INTRAVENOUS

## 2021-08-23 MED ORDER — LIDOCAINE HCL (CARDIAC) PF 100 MG/5ML IV SOSY
PREFILLED_SYRINGE | INTRAVENOUS | Status: DC | PRN
  Administered 2021-08-23 (×2): 60 mg via INTRAVENOUS

## 2021-08-23 MED ORDER — SODIUM CHLORIDE 0.9 % IR SOLN
Status: DC | PRN
  Administered 2021-08-23: 12000 mL via INTRAVESICAL

## 2021-08-23 MED ORDER — FAMOTIDINE 20 MG PO TABS: 20.0000 mg | ORAL_TABLET | Freq: Once | ORAL | Status: AC

## 2021-08-23 MED ORDER — OXYCODONE HCL 5 MG/5ML PO SOLN: 5.0000 mg | Freq: Once | ORAL | Status: DC | PRN

## 2021-08-23 MED ORDER — CHLORHEXIDINE GLUCONATE 0.12 % MT SOLN: 15.0000 mL | Freq: Once | OROMUCOSAL | Status: AC

## 2021-08-23 MED ORDER — DEXAMETHASONE SODIUM PHOSPHATE 10 MG/ML IJ SOLN
INTRAMUSCULAR | Status: AC
  Filled 2021-08-23: qty 1

## 2021-08-23 MED ORDER — CHLORHEXIDINE GLUCONATE 0.12 % MT SOLN
OROMUCOSAL | Status: AC
  Administered 2021-08-23: 15 mL via OROMUCOSAL
  Filled 2021-08-23: qty 15

## 2021-08-23 MED ORDER — PROPOFOL 10 MG/ML IV BOLUS
INTRAVENOUS | Status: DC | PRN
  Administered 2021-08-23: 150 mg via INTRAVENOUS

## 2021-08-23 MED ORDER — MIDAZOLAM HCL 2 MG/2ML IJ SOLN
INTRAMUSCULAR | Status: AC
  Filled 2021-08-23: qty 2

## 2021-08-23 MED ORDER — ACETAMINOPHEN 10 MG/ML IV SOLN
INTRAVENOUS | Status: DC | PRN
  Administered 2021-08-23: 1000 mg via INTRAVENOUS

## 2021-08-23 MED ORDER — OXYCODONE HCL 5 MG PO TABS: 5.0000 mg | ORAL_TABLET | Freq: Once | ORAL | Status: DC | PRN

## 2021-08-23 MED ORDER — DEXMEDETOMIDINE (PRECEDEX) IN NS 20 MCG/5ML (4 MCG/ML) IV SYRINGE
PREFILLED_SYRINGE | INTRAVENOUS | Status: DC | PRN
  Administered 2021-08-23: 4 ug via INTRAVENOUS
  Administered 2021-08-23 (×2): 8 ug via INTRAVENOUS

## 2021-08-23 MED ORDER — LIDOCAINE HCL (PF) 2 % IJ SOLN
INTRAMUSCULAR | Status: AC
  Filled 2021-08-23: qty 5

## 2021-08-23 MED ORDER — ONDANSETRON HCL 4 MG/2ML IJ SOLN
INTRAMUSCULAR | Status: DC | PRN
  Administered 2021-08-23: 4 mg via INTRAVENOUS

## 2021-08-23 MED ORDER — ACETAMINOPHEN 10 MG/ML IV SOLN
INTRAVENOUS | Status: AC
  Filled 2021-08-23: qty 100

## 2021-08-23 MED ORDER — MIDAZOLAM HCL 2 MG/2ML IJ SOLN
INTRAMUSCULAR | Status: DC | PRN
  Administered 2021-08-23: 2 mg via INTRAVENOUS

## 2021-08-23 MED ORDER — CIPROFLOXACIN IN D5W 400 MG/200ML IV SOLN
INTRAVENOUS | Status: AC
  Filled 2021-08-23: qty 200

## 2021-08-23 MED ORDER — CIPROFLOXACIN IN D5W 400 MG/200ML IV SOLN
400.0000 mg | INTRAVENOUS | Status: AC
  Administered 2021-08-23: 400 mg via INTRAVENOUS

## 2021-08-23 MED ORDER — HYDROCODONE-ACETAMINOPHEN 5-325 MG PO TABS: 1.0000 | ORAL_TABLET | Freq: Four times a day (QID) | ORAL | 0 refills | Status: AC | PRN

## 2021-08-23 SURGICAL SUPPLY — 36 items
ADAPTER IRRIG TUBE 2 SPIKE SOL (ADAPTER) ×4 IMPLANT
ADPR TBG 2 SPK PMP STRL ASCP (ADAPTER) ×2
BAG DRN LRG CPC RND TRDRP CNTR (MISCELLANEOUS) ×1
BAG URO DRAIN 4000ML (MISCELLANEOUS) ×2 IMPLANT
CATH FOLEY 3WAY 30CC 24FR (CATHETERS) ×2
CATH URETL OPEN END 4X70 (CATHETERS) ×2 IMPLANT
CATH URTH STD 24FR FL 3W 2 (CATHETERS) ×1 IMPLANT
CONTAINER COLLECT MORCELLATR (MISCELLANEOUS) ×1 IMPLANT
DRAPE UTILITY 15X26 TOWEL STRL (DRAPES) IMPLANT
ELECT BIVAP BIPO 22/24 DONUT (ELECTROSURGICAL)
ELECTRD BIVAP BIPO 22/24 DONUT (ELECTROSURGICAL) IMPLANT
FIBER LASER MOSES 550 DFL (Laser) ×2 IMPLANT
FILTER OVERFLOW MORCELLATOR (FILTER) ×1 IMPLANT
GLOVE SURG UNDER POLY LF SZ7.5 (GLOVE) ×2 IMPLANT
GOWN STRL REUS W/ TWL LRG LVL3 (GOWN DISPOSABLE) ×1 IMPLANT
GOWN STRL REUS W/ TWL XL LVL3 (GOWN DISPOSABLE) ×1 IMPLANT
GOWN STRL REUS W/TWL LRG LVL3 (GOWN DISPOSABLE) ×2
GOWN STRL REUS W/TWL XL LVL3 (GOWN DISPOSABLE) ×2
HOLDER FOLEY CATH W/STRAP (MISCELLANEOUS) ×2 IMPLANT
IV NS IRRIG 3000ML ARTHROMATIC (IV SOLUTION) ×17 IMPLANT
KIT TURNOVER CYSTO (KITS) ×2 IMPLANT
MANIFOLD NEPTUNE II (INSTRUMENTS) IMPLANT
MBRN O SEALING YLW 17 FOR INST (MISCELLANEOUS) ×2
MEMBRANE SLNG YLW 17 FOR INST (MISCELLANEOUS) ×1 IMPLANT
MORCELLATOR COLLECT CONTAINER (MISCELLANEOUS) ×2
MORCELLATOR OVERFLOW FILTER (FILTER) ×2
MORCELLATOR ROTATION 4.75 335 (MISCELLANEOUS) ×2 IMPLANT
PACK CYSTO AR (MISCELLANEOUS) ×2 IMPLANT
SET CYSTO W/LG BORE CLAMP LF (SET/KITS/TRAYS/PACK) ×2 IMPLANT
SET IRRIG Y TYPE TUR BLADDER L (SET/KITS/TRAYS/PACK) ×2 IMPLANT
SLEEVE PROTECTION STRL DISP (MISCELLANEOUS) ×4 IMPLANT
SURGILUBE 2OZ TUBE FLIPTOP (MISCELLANEOUS) ×2 IMPLANT
SYR TOOMEY IRRIG 70ML (MISCELLANEOUS) ×2
SYRINGE TOOMEY IRRIG 70ML (MISCELLANEOUS) ×1 IMPLANT
TUBE PUMP MORCELLATOR PIRANHA (TUBING) ×2 IMPLANT
WATER STERILE IRR 1000ML POUR (IV SOLUTION) ×2 IMPLANT

## 2021-08-23 NOTE — TOC Initial Note (Signed)
Transition of Care [facility]) - Initial/Assessment Note    Patient Details  Name: Calvin Calvin [name] MRN: [id number] Date of Birth: January 06, 1956  Transition of Care [facility]) CM/SW Contact:    Marlowe Sax, RN Phone Number: 08/23/2021, 10:16 AM  Clinical Narrative:                  Transition of Care [facility]) Screening Note   Patient Details  Name: Calvin Calvin [name] Date of Birth: 01/02/1956   Transition of Care [facility]) CM/SW Contact:    Marlowe Sax, RN Phone Number: 08/23/2021, 10:16 AM    Transition of Care Department [facility]) has reviewed patient and no TOC needs have been identified at this time. We will continue to monitor patient advancement through interdisciplinary progression rounds. If new patient transition needs arise, please place a TOC consult.          Patient Goals and CMS Choice        Expected Discharge Plan and Services                                                Prior Living Arrangements/Services                       Activities of Daily Living Home Assistive Devices/Equipment: Eyeglasses ADL Screening (condition at time of admission) Patient's cognitive ability adequate to safely complete daily activities?: Yes Is the patient deaf or have difficulty hearing?: No Does the patient have difficulty seeing, even when wearing glasses/contacts?: No Does the patient have difficulty concentrating, remembering, or making decisions?: No Patient able to express need for assistance with ADLs?: Yes Does the patient have difficulty dressing or bathing?: No Independently performs ADLs?: Yes (appropriate for developmental age) Does the patient have difficulty walking or climbing stairs?: No Weakness of Legs: None Weakness of Arms/Hands: None  Permission Sought/Granted                  Emotional Assessment              Admission diagnosis:  Benign Prostatic Hyperplasia with Urinary Obstruction Patient Active Problem Calvin [name]    Diagnosis Date Noted   Fatigue 02/01/2014   PCP:  Ileana Ladd, MD (Inactive) Pharmacy:   [facility] [phone] - [place], [place] - [phone] [facility] [address] [place] [address] Phone: [phone] Fax: [phone]     Social Determinants of Health (SDOH) Interventions    Readmission Risk Interventions     No data to display

## 2021-08-23 NOTE — Anesthesia Procedure Notes (Signed)
Procedure Name: Intubation Date/Time: 08/23/2021 9:44 AM  Performed by: Irving Burton, CRNAPre-anesthesia Checklist: Patient identified, Patient being monitored, Timeout performed, Emergency Drugs available and Suction available Patient Re-evaluated:Patient Re-evaluated prior to induction Oxygen Delivery Method: Circle system utilized Preoxygenation: Pre-oxygenation with 100% oxygen Induction Type: IV induction Ventilation: Mask ventilation without difficulty Laryngoscope Size: McGraph and 4 Grade View: Grade I Tube type: Oral Tube size: 7.5 mm Number of attempts: 1 Airway Equipment and Method: Stylet and Video-laryngoscopy Placement Confirmation: ETT inserted through vocal cords under direct vision, positive ETCO2 and breath sounds checked- equal and bilateral Secured at: 23 cm Tube secured with: Tape Dental Injury: Teeth and Oropharynx as per pre-operative assessment

## 2021-08-23 NOTE — Op Note (Signed)
Date of procedure: 08/23/21  Preoperative diagnosis:  BPH with retention  Postoperative diagnosis:  Same  Procedure: HoLEP (Holmium Laser Enucleation of the Prostate)  Surgeon: Legrand Rams, MD  Anesthesia: General  Complications: None  Intraoperative findings:  Large prostate with obstructing lateral lobes, moderate bladder trabeculations, no suspicious lesions Uncomplicated HOLEP, ureteral orifices and verumontanum intact at conclusion of case  EBL: 10 mL  Specimens: Prostate chips  Enucleation time: 35 minutes  Morcellation time: 20 minutes  Intra-op weight: 75 g  Drains: 24 French three-way, 60 cc in balloon  Indication: Calvin [name] is a 66 y.o. patient with BPH and Foley dependent urinary retention with prostate measuring 120 g on outside ultrasound.  After reviewing the management options for treatment, they elected to proceed with the above surgical procedure(s). We have discussed the potential benefits and risks of the procedure, side effects of the proposed treatment, the likelihood of the patient achieving the goals of the procedure, and any potential problems that might occur during the procedure or recuperation.  We specifically discussed the risks of bleeding, infection, hematuria and clot retention, need for additional procedures, possible overnight hospital stay, temporary urgency and incontinence, rare long-term incontinence, and retrograde ejaculation.  Informed consent has been obtained.   Description of procedure:  The patient was taken to the operating room and general anesthesia was induced.  The patient was placed in the dorsal lithotomy position, prepped and draped in the usual sterile fashion, and preoperative antibiotics(Cipro) were administered.  SCDs were placed for DVT prophylaxis.  A preoperative time-out was performed.   Sissy Hoff sounds were used to gently dilated the urethra up to 1F. The 7 French continuous flow resectoscope was inserted  into the urethra using the visual obturator  The prostate was large with obstructing lateral lobes. The bladder was thoroughly inspected and notable for moderate trabeculations but no suspicious lesions.  The ureteral orifices were located in orthotopic position.  The laser was set to 2 J and 60 Hz and was used to make a lambda incision just proximal to the verumontanum down to the level of the capsule. Early apical release was performed by making a circumferential mucosal incision proximal to the sphincter. A 6 o'clock incision was then made down to the level of the capsule from the bladder neck to the verumontanum.  The lateral lobes were then incised circumferentially until they were disconnected from the surrounding tissue.  The capsule was examined and laser was used for meticulous hemostasis.    The 69 French resectoscope was then switched out for the 26 French nephroscope and the lobes were morcellated and the tissue sent to pathology.  A 24 French three-way catheter was inserted easily with the aid of a catheter guide, and 60 cc were placed in the balloon.  Urine was faint pink.  The catheter irrigated easily with a Toomey syringe.  CBI was initiated.   The patient tolerated the procedure well without any immediate complications and was extubated and transferred to the recovery room in stable condition.  Urine was clear on fast CBI.  Disposition: Stable to PACU  Plan: Wean CBI in PACU, anticipate discharge home today with Foley removal in clinic in 2-3 days   Legrand Rams, MD 08/23/2021

## 2021-08-23 NOTE — Transfer of Care (Signed)
Immediate Anesthesia Transfer of Care Note  Patient: Zakariyya Helfman Kaner  Procedure(s) Performed: HOLEP-LASER ENUCLEATION OF THE PROSTATE WITH MORCELLATION  Patient Location: PACU  Anesthesia Type:General  Level of Consciousness: sedated  Airway & Oxygen Therapy: Patient Spontanous Breathing and Patient connected to face mask oxygen  Post-op Assessment: Report given to RN and Post -op Vital signs reviewed and stable  Post vital signs: Reviewed and stable  Last Vitals:  Vitals Value Taken Time  BP 114/67   Temp    Pulse 61 08/23/21 1133  Resp    SpO2 98 % 08/23/21 1133  Vitals shown include unvalidated device data.  Last Pain:  Vitals:   08/23/21 0739  TempSrc: Temporal      Patients Stated Pain Goal: 0 (08/23/21 0739)  Complications: No notable events documented.

## 2021-08-23 NOTE — Anesthesia Postprocedure Evaluation (Signed)
Anesthesia Post Note  Patient: Mohit Zirbes Leeb  Procedure(s) Performed: HOLEP-LASER ENUCLEATION OF THE PROSTATE WITH MORCELLATION  Patient location during evaluation: PACU Anesthesia Type: General Level of consciousness: awake and alert Pain management: pain level controlled Vital Signs Assessment: post-procedure vital signs reviewed and stable Respiratory status: spontaneous breathing, nonlabored ventilation, respiratory function stable and patient connected to nasal cannula oxygen Cardiovascular status: blood pressure returned to baseline and stable Postop Assessment: no apparent nausea or vomiting Anesthetic complications: no   No notable events documented.   Last Vitals:  Vitals:   08/23/21 1215 08/23/21 1222  BP: 132/84 (!) 150/95  Pulse: (!) 58 62  Resp: 18 18  Temp: 36.6 C (!) 36.2 C  SpO2: 97% 100%    Last Pain:  Vitals:   08/23/21 1222  TempSrc: Temporal  PainSc: 0-No pain                 Stephanie Coup

## 2021-08-23 NOTE — Discharge Instructions (Signed)

## 2021-08-23 NOTE — Interval H&P Note (Signed)
UROLOGY H&P UPDATE  Agree with prior H&P dated 07/30/21.  Cardiac: RRR Lungs: CTA bilaterally  Laterality: N/A Procedure: HOLEP  Urine: Culture 6/20 with E. coli, treated with Bactrim  We discussed the risks and benefits of HoLEP at length.  The procedure requires general anesthesia and takes 1 to 2 hours, and a holmium laser is used to enucleate the prostate and push this tissue into the bladder.  A morcellator is then used to remove this tissue, which is sent for pathology.  The vast majority(>95%) of patients are able to discharge the same day with a catheter in place for 2 to 3 days, and will follow-up in clinic for a voiding trial.  We specifically discussed the risks of bleeding, infection, retrograde ejaculation, temporary urgency and urge incontinence, very low risk of long-term incontinence, urethral stricture/bladder neck contracture, pathologic evaluation of prostate tissue and possible detection of prostate cancer or other malignancy, and possible need for additional procedures.    Sondra Come, MD 08/23/2021

## 2021-08-23 NOTE — Anesthesia Preprocedure Evaluation (Signed)
Anesthesia Evaluation  Patient identified by MRN, date of birth, ID band Patient awake    Reviewed: Allergy & Precautions, NPO status , Patient's Chart, lab work & pertinent test results  Airway Mallampati: II  TM Distance: >3 FB Neck ROM: full    Dental  (+) Teeth Intact   Pulmonary neg pulmonary ROS,    Pulmonary exam normal        Cardiovascular negative cardio ROS Normal cardiovascular exam     Neuro/Psych negative neurological ROS  negative psych ROS   GI/Hepatic Neg liver ROS, GERD  ,  Endo/Other  negative endocrine ROS  Renal/GU      Musculoskeletal   Abdominal   Peds  Hematology negative hematology ROS (+)   Anesthesia Other Findings Past Medical History: 07/2021: Benign prostatic hyperplasia with urinary obstruction No date: Elevated PSA No date: GERD (gastroesophageal reflux disease) No date: Paraphimosis No date: Urinary retention  Past Surgical History: No date: COLONOSCOPY 06/14/2021: CYSTOSCOPY     Comment:  in office No date: UPPER GI ENDOSCOPY 1983: WISDOM TOOTH EXTRACTION  BMI    Body Mass Index: 25.09 kg/m      Reproductive/Obstetrics negative OB ROS                            Anesthesia Physical Anesthesia Plan  ASA: 2  Anesthesia Plan: General   Post-op Pain Management:    Induction: Intravenous  PONV Risk Score and Plan: 2 and Ondansetron, Dexamethasone, Midazolam and Treatment may vary due to age or medical condition  Airway Management Planned: Oral ETT  Additional Equipment:   Intra-op Plan:   Post-operative Plan: Extubation in OR  Informed Consent: I have reviewed the patients History and Physical, chart, labs and discussed the procedure including the risks, benefits and alternatives for the proposed anesthesia with the patient or authorized representative who has indicated his/her understanding and acceptance.     Dental Advisory  Given  Plan Discussed with: Anesthesiologist, CRNA and Surgeon  Anesthesia Plan Comments: (Patient consented for risks of anesthesia including but not limited to:  - adverse reactions to medications - damage to eyes, teeth, lips or other oral mucosa - nerve damage due to positioning  - sore throat or hoarseness - Damage to heart, brain, nerves, lungs, other parts of body or loss of life  Patient voiced understanding.)       Anesthesia Quick Evaluation

## 2021-08-26 ENCOUNTER — Ambulatory Visit: Payer: Federal, State, Local not specified - PPO | Admitting: Physician Assistant

## 2021-08-26 ENCOUNTER — Ambulatory Visit (INDEPENDENT_AMBULATORY_CARE_PROVIDER_SITE_OTHER): Payer: Medicare Other | Admitting: Physician Assistant

## 2021-08-26 DIAGNOSIS — N138 Other obstructive and reflux uropathy: Secondary | ICD-10-CM | POA: Diagnosis not present

## 2021-08-26 DIAGNOSIS — N401 Enlarged prostate with lower urinary tract symptoms: Secondary | ICD-10-CM | POA: Diagnosis not present

## 2021-08-26 LAB — BLADDER SCAN AMB NON-IMAGING

## 2021-08-26 NOTE — Progress Notes (Signed)
Afternoon follow-up  Patient returned to clinic this afternoon for repeat PVR. He reports drinking approximately 24oz of fluid. He has been able to urinate. He has had large volumes of urinary leakage. PVR 61mL.  Results for orders placed or performed in visit on 08/26/21  Bladder Scan (Post Void Residual) in office  Result Value Ref Range   Scan Result 71mL     Voiding trial passed. Counseled patient on normal postoperative findings including dysuria, gross hematuria, and urinary urgency/leakage. Counseled patient to begin Kegel exercises 3x10 sets daily to increase urinary control and wear absorbent products as needed for security. Written and verbal resources provided today. Surgical pathology pending; will defer to Dr. Richardo Hanks to share results when available.   Follow up: Return in about 12 weeks (around 11/18/2021) for Postop f/u with IPSS and PVR.

## 2021-08-26 NOTE — Patient Instructions (Signed)

## 2021-08-26 NOTE — Progress Notes (Signed)
Catheter Removal  Patient is present today for a catheter removal.  16ml of water was drained from the balloon. A 24FR foley cath was removed from the bladder no complications were noted . Patient tolerated well.  Performed by: Franchot Erichsen CMA  Follow up/ Additional notes: RTC this afternoon for PVR.

## 2021-08-28 LAB — SURGICAL PATHOLOGY

## 2021-11-14 ENCOUNTER — Encounter: Payer: Self-pay | Admitting: Urology

## 2021-11-14 ENCOUNTER — Ambulatory Visit (INDEPENDENT_AMBULATORY_CARE_PROVIDER_SITE_OTHER): Payer: Medicare Other | Admitting: Urology

## 2021-11-14 VITALS — BP 165/98 | HR 65 | Ht 72.0 in | Wt 185.0 lb

## 2021-11-14 DIAGNOSIS — N401 Enlarged prostate with lower urinary tract symptoms: Secondary | ICD-10-CM

## 2021-11-14 DIAGNOSIS — N138 Other obstructive and reflux uropathy: Secondary | ICD-10-CM

## 2021-11-14 LAB — BLADDER SCAN AMB NON-IMAGING

## 2021-11-14 NOTE — Progress Notes (Signed)
   11/14/2021 9:29 AM   Calvin [name] [birth year] [id number]  Reason for visit: Follow up BPH status post HOLEP  HPI: 66 year old male who was referred from Dr. Diona Fanti for Foley dependent urinary retention, and prostate measured 122 g.  He he had had an indwelling Foley since February 2023.  He underwent an uncomplicated HOLEP on 3/95/3202 with removal of 92 g of benign tissue.  He apparently was having gross hematuria with Kegel exercises, and called the office and was told to stop those.  He has not had any gross hematuria in the last month.  He does fine overnight with no leakage, and urinates with a strong stream in the morning, but has some urgency, frequency, and incontinence during the day.  He drinks only water.  PVR is normal today at 1 mL.  -I encouraged him to resume Kegel exercises, and samples of Gemtesa x4 weeks were given.  Anticipate he will continue to improve, but with his longstanding obstruction, Foley dependent retention for 6 months, anticipate he may have a longer healing time. -RTC with me 4 months PVR    Billey Co, MD  [facility] [address] [place], [place] [address] [phone]

## 2021-11-14 NOTE — Patient Instructions (Signed)
Kegel Exercises  Kegel exercises can help strengthen your pelvic floor muscles. The pelvic floor is a group of muscles that support your rectum, small intestine, and bladder. In females, pelvic floor muscles also help support the uterus. These muscles help you control the flow of urine and stool (feces). Kegel exercises are painless and simple. They do not require any equipment. Your provider may suggest Kegel exercises to: Improve bladder and bowel control. Improve sexual response. Improve weak pelvic floor muscles after surgery to remove the uterus (hysterectomy) or after pregnancy, in females. Improve weak pelvic floor muscles after prostate gland removal or surgery, in males. Kegel exercises involve squeezing your pelvic floor muscles. These are the same muscles you squeeze when you try to stop the flow of urine or keep from passing gas. The exercises can be done while sitting, standing, or lying down, but it is best to vary your position. Ask your health care provider which exercises are safe for you. Do exercises exactly as told by your health care provider and adjust them as directed. Do not begin these exercises until told by your health care provider. Exercises How to do Kegel exercises: Squeeze your pelvic floor muscles tight. You should feel a tight lift in your rectal area. If you are a male, you should also feel a tightness in your vaginal area. Keep your stomach, buttocks, and legs relaxed. Hold the muscles tight for up to 10 seconds. Breathe normally. Relax your muscles for up to 10 seconds. Repeat as told by your health care provider. Repeat this exercise daily as told by your health care provider. Continue to do this exercise for at least 4-6 weeks, or for as long as told by your health care provider. You may be referred to a physical therapist who can help you learn more about how to do Kegel exercises. Depending on your condition, your health care provider may  recommend: Varying how long you squeeze your muscles. Doing several sets of exercises every day. Doing exercises for several weeks. Making Kegel exercises a part of your regular exercise routine. This information is not intended to replace advice given to you by your health care provider. Make sure you discuss any questions you have with your health care provider. Document Revised: 06/07/2020 Document Reviewed: 06/07/2020 Elsevier Patient Education  2023 Elsevier Inc.  

## 2021-12-09 ENCOUNTER — Other Ambulatory Visit: Payer: Self-pay

## 2021-12-09 MED ORDER — GEMTESA 75 MG PO TABS: 1.0000 | ORAL_TABLET | ORAL | 3 refills | Status: AC

## 2021-12-31 ENCOUNTER — Encounter: Payer: Self-pay | Admitting: Urology

## 2022-02-26 ENCOUNTER — Encounter: Payer: Self-pay | Admitting: Urology

## 2022-03-19 ENCOUNTER — Ambulatory Visit: Payer: Medicare Other | Admitting: Urology

## 2022-03-20 ENCOUNTER — Ambulatory Visit: Payer: Medicare Other | Admitting: Urology

## 2022-03-26 ENCOUNTER — Ambulatory Visit (INDEPENDENT_AMBULATORY_CARE_PROVIDER_SITE_OTHER): Payer: Medicare Other | Admitting: Urology

## 2022-03-26 VITALS — BP 164/98 | HR 82 | Ht 72.0 in | Wt 185.0 lb

## 2022-03-26 DIAGNOSIS — N393 Stress incontinence (female) (male): Secondary | ICD-10-CM | POA: Diagnosis not present

## 2022-03-26 DIAGNOSIS — N401 Enlarged prostate with lower urinary tract symptoms: Secondary | ICD-10-CM

## 2022-03-26 LAB — BLADDER SCAN AMB NON-IMAGING: SCA Result: 10

## 2022-03-26 NOTE — Progress Notes (Signed)
   03/26/2022 10:26 AM   Calvin [name] [birth year] [id number]  Reason for visit: Follow up BPH status post HOLEP  HPI: 67 year old male who was referred from Dr. Diona Fanti for Foley dependent urinary retention, and prostate measured 122 g.  He he had had an indwelling Foley for 5 months preoperatively.  He underwent an uncomplicated HOLEP on 7/34/2876 with removal of 92 g of benign tissue.  Urinary symptoms have continued to improve.  He is urinating with a strong stream, and emptying completely, PVR today normal at 10 mL.  He denies any significant urgency or frequency, and he does well overnight with nocturia 0-1 time, no leakage overnight.  He is having some persistently bothersome stress incontinence during the day that requires 1-2 depends.  He primarily leaks with bending over.  We again reviewed Kegel exercises, and I do not think he has a good understanding of how to perform this.  He is a difficult historian, but from what I can understand he is more just performing a Valsalva maneuver instead of actual Kegel exercises.  I think he would benefit significantly from pelvic floor physical therapy, and referral was placed.  -Referral placed to pelvic floor physical therapy, again reviewed correct Kegel exercise strategy -RTC 6 months PVR -Consider Cunningham/Wiesner clamp in the future if no improvement   Billey Co, MD  [facility] [address], [place],  [address] [phone]

## 2022-03-26 NOTE — Patient Instructions (Addendum)
Kegel Exercises  Kegel exercises can help strengthen your pelvic floor muscles. The pelvic floor is a group of muscles that support your rectum, small intestine, and bladder. In females, pelvic floor muscles also help support the uterus. These muscles help you control the flow of urine and stool (feces). Kegel exercises are painless and simple. They do not require any equipment. Your provider may suggest Kegel exercises to: Improve bladder and bowel control. Improve sexual response. Improve weak pelvic floor muscles after surgery to remove the uterus (hysterectomy) or after pregnancy, in females. Improve weak pelvic floor muscles after prostate gland removal or surgery, in males. Kegel exercises involve squeezing your pelvic floor muscles. These are the same muscles you squeeze when you try to stop the flow of urine or keep from passing gas. The exercises can be done while sitting, standing, or lying down, but it is best to vary your position. Ask your health care provider which exercises are safe for you. Do exercises exactly as told by your health care provider and adjust them as directed. Do not begin these exercises until told by your health care provider. Exercises How to do Kegel exercises: Squeeze your pelvic floor muscles tight. You should feel a tight lift in your rectal area. If you are a male, you should also feel a tightness in your vaginal area. Keep your stomach, buttocks, and legs relaxed. Hold the muscles tight for up to 10 seconds. Breathe normally. Relax your muscles for up to 10 seconds. Repeat as told by your health care provider. Repeat this exercise daily as told by your health care provider. Continue to do this exercise for at least 4-6 weeks, or for as long as told by your health care provider. You may be referred to a physical therapist who can help you learn more about how to do Kegel exercises. Depending on your condition, your health care provider may  recommend: Varying how long you squeeze your muscles. Doing several sets of exercises every day. Doing exercises for several weeks. Making Kegel exercises a part of your regular exercise routine. This information is not intended to replace advice given to you by your health care provider. Make sure you discuss any questions you have with your health care provider. Document Revised: 06/07/2020 Document Reviewed: 06/07/2020 Elsevier Patient Education  [place].  If you are continuing to have leakage over the next few months despite working on the Kegel exercises, you can order a Cunningham clamp or Wiesner clamp from [employer].  You can wear this during the day or when you are physically active and can help prevent leakage.

## 2022-10-30 ENCOUNTER — Ambulatory Visit: Payer: Medicare Other | Admitting: Urology

## 2022-11-06 ENCOUNTER — Ambulatory Visit: Payer: Medicare Other | Admitting: Urology

## 2022-11-06 ENCOUNTER — Encounter: Payer: Self-pay | Admitting: Urology

## 2022-11-06 VITALS — BP 166/94 | HR 88 | Ht 72.0 in | Wt 185.0 lb

## 2022-11-06 DIAGNOSIS — Z09 Encounter for follow-up examination after completed treatment for conditions other than malignant neoplasm: Secondary | ICD-10-CM

## 2022-11-06 DIAGNOSIS — N393 Stress incontinence (female) (male): Secondary | ICD-10-CM | POA: Diagnosis not present

## 2022-11-06 DIAGNOSIS — N138 Other obstructive and reflux uropathy: Secondary | ICD-10-CM

## 2022-11-06 DIAGNOSIS — Z87438 Personal history of other diseases of male genital organs: Secondary | ICD-10-CM

## 2022-11-06 LAB — BLADDER SCAN AMB NON-IMAGING

## 2022-11-06 NOTE — Progress Notes (Signed)
11/06/2022 8:54 AM   Calvin [name] Jul 08, 1955 [id number]  Reason for visit: Follow up BPH status post HOLEP  HPI: 67 year old male who was referred from Dr. Retta Diones for Foley dependent urinary retention, and prostate measured 122 g.  He he had had an indwelling Foley for 5 months preoperatively.  He underwent an uncomplicated HOLEP on 08/23/2021 with removal of 92 g of benign tissue.  He has been voiding well since that time with a strong urinary stream, denies any urgency, frequency, or nocturia.  He is not having any incontinence overnight.  He continues to have some persistently bothersome stress incontinence during the day with strenuous activity that requires 1 depends.  He reports that it is damp at the end of the day.  At our last visit I recommended pelvic floor physical therapy, and he only recently started working with them and has had 2 visits.  He does think this has helped.  I strongly encouraged him to continue to work with pelvic floor physical therapy.  PVR today is normal at 9ml.  We discussed other alternatives like a Cunningham clamp.  Continue to work with pelvic floor physical therapy RTC 6 months PVR, if persistent stress incontinence at that time will place referral to Dr. Gaspar Bidding and in [place] to consider sling    Sondra Come, MD  [facility] [address] [place], [place] [address] [phone]

## 2023-05-07 ENCOUNTER — Ambulatory Visit (INDEPENDENT_AMBULATORY_CARE_PROVIDER_SITE_OTHER): Payer: Medicare Other | Admitting: Urology

## 2023-05-07 VITALS — BP 184/95 | HR 80 | Ht 72.0 in | Wt 209.0 lb

## 2023-05-07 DIAGNOSIS — N401 Enlarged prostate with lower urinary tract symptoms: Secondary | ICD-10-CM | POA: Diagnosis not present

## 2023-05-07 DIAGNOSIS — R030 Elevated blood-pressure reading, without diagnosis of hypertension: Secondary | ICD-10-CM | POA: Diagnosis not present

## 2023-05-07 DIAGNOSIS — N393 Stress incontinence (female) (male): Secondary | ICD-10-CM

## 2023-05-07 DIAGNOSIS — N138 Other obstructive and reflux uropathy: Secondary | ICD-10-CM | POA: Diagnosis not present

## 2023-05-07 LAB — BLADDER SCAN AMB NON-IMAGING

## 2023-05-07 NOTE — Progress Notes (Signed)
   05/07/2023 9:50 AM   Calvin [name] 26-Nov-1955 [id number]  Reason for visit: Follow up BPH status post HOLEP  HPI: 68 year old male who was referred from Dr. Retta Diones for Foley dependent urinary retention, and prostate measured 122 g.  He he had had an indwelling Foley for 5 months preoperatively.  He underwent an uncomplicated HOLEP on 08/23/2021 with removal of 92 g of benign tissue.  He has been voiding well since that time with a strong urinary stream, denies any urgency, frequency, or nocturia.  He is not having any incontinence overnight.  He had some persistent stress incontinence postoperatively, but that has continued to improve over the last 6 months.  He [facility] [place] but has not seen them recently.  He did feel that was helpful.  Currently he is having a small amount of leakage with lifting or bending quickly, wears a depends during the day but this is minimally damp in the evening.  From the history it sounds like he could probably use just a pad as needed.  PVR today normal at 60ml.  I again offered referral to Dr Lafonda Mosses to consider a urethral sling, but he is not bothered enough at this time, and realistically I think his symptoms are pretty mild from the history that would not warrant surgical intervention.  We also reviewed Kegel exercises again today, and I encouraged him to consider trying pelvic floor physical therapy again  Follow-up with urology as needed  Sondra Come, MD  [facility] [address] [place], [place] [address] [phone]

## 2023-05-07 NOTE — Patient Instructions (Signed)
 Hypertension, Adult High blood pressure (hypertension) is when the force of blood pumping through the arteries is too strong. The arteries are the blood vessels that carry blood from the heart throughout the body. Hypertension forces the heart to work harder to pump blood and may cause arteries to become narrow or stiff. Untreated or uncontrolled hypertension can lead to a heart attack, heart failure, a stroke, kidney disease, and other problems. A blood pressure reading consists of a higher number over a lower number. Ideally, your blood pressure should be below 120/80. The first ("top") number is called the systolic pressure. It is a measure of the pressure in your arteries as your heart beats. The second ("bottom") number is called the diastolic pressure. It is a measure of the pressure in your arteries as the heart relaxes. What are the causes? The exact cause of this condition is not known. There are some conditions that result in high blood pressure. What increases the risk? Certain factors may make you more likely to develop high blood pressure. Some of these risk factors are under your control, including: Smoking. Not getting enough exercise or physical activity. Being overweight. Having too much fat, sugar, calories, or salt (sodium) in your diet. Drinking too much alcohol. Other risk factors include: Having a personal history of heart disease, diabetes, high cholesterol, or kidney disease. Stress. Having a family history of high blood pressure and high cholesterol. Having obstructive sleep apnea. Age. The risk increases with age. What are the signs or symptoms? High blood pressure may not cause symptoms. Very high blood pressure (hypertensive crisis) may cause: Headache. Fast or irregular heartbeats (palpitations). Shortness of breath. Nosebleed. Nausea and vomiting. Vision changes. Severe chest pain, dizziness, and seizures. How is this diagnosed? This condition is diagnosed by  measuring your blood pressure while you are seated, with your arm resting on a flat surface, your legs uncrossed, and your feet flat on the floor. The cuff of the blood pressure monitor will be placed directly against the skin of your upper arm at the level of your heart. Blood pressure should be measured at least twice using the same arm. Certain conditions can cause a difference in blood pressure between your right and left arms. If you have a high blood pressure reading during one visit or you have normal blood pressure with other risk factors, you may be asked to: Return on a different day to have your blood pressure checked again. Monitor your blood pressure at home for 1 week or longer. If you are diagnosed with hypertension, you may have other blood or imaging tests to help your health care provider understand your overall risk for other conditions. How is this treated? This condition is treated by making healthy lifestyle changes, such as eating healthy foods, exercising more, and reducing your alcohol intake. You may be referred for counseling on a healthy diet and physical activity. Your health care provider may prescribe medicine if lifestyle changes are not enough to get your blood pressure under control and if: Your systolic blood pressure is above 130. Your diastolic blood pressure is above 80. Your personal target blood pressure may vary depending on your medical conditions, your age, and other factors. Follow these instructions at home: Eating and drinking  Eat a diet that is high in fiber and potassium, and low in sodium, added sugar, and fat. An example of this eating plan is called the DASH diet. DASH stands for Dietary Approaches to Stop Hypertension. To eat this way: Eat  plenty of fresh fruits and vegetables. Try to fill one half of your plate at each meal with fruits and vegetables. Eat whole grains, such as whole-wheat pasta, brown rice, or whole-grain bread. Fill about one  fourth of your plate with whole grains. Eat or drink low-fat dairy products, such as skim milk or low-fat yogurt. Avoid fatty cuts of meat, processed or cured meats, and poultry with skin. Fill about one fourth of your plate with lean proteins, such as fish, chicken without skin, beans, eggs, or tofu. Avoid pre-made and processed foods. These tend to be higher in sodium, added sugar, and fat. Reduce your daily sodium intake. Many people with hypertension should eat less than 1,500 mg of sodium a day. Do not drink alcohol if: Your health care provider tells you not to drink. You are pregnant, may be pregnant, or are planning to become pregnant. If you drink alcohol: Limit how much you have to: 0-1 drink a day for women. 0-2 drinks a day for men. Know how much alcohol is in your drink. In the [place] one drink equals one 12 oz bottle of beer (355 mL), one 5 oz glass of wine (148 mL), or one 1 oz glass of hard liquor (44 mL). Lifestyle  Work with your health care provider to maintain a healthy body weight or to lose weight. Ask what an ideal weight is for you. Get at least 30 minutes of exercise that causes your heart to beat faster (aerobic exercise) most days of the week. Activities may include walking, swimming, or biking. Include exercise to strengthen your muscles (resistance exercise), such as Pilates or lifting weights, as part of your weekly exercise routine. Try to do these types of exercises for 30 minutes at least 3 days a week. Do not use any products that contain nicotine or tobacco. These products include cigarettes, chewing tobacco, and vaping devices, such as e-cigarettes. If you need help quitting, ask your health care provider. Monitor your blood pressure at home as told by your health care provider. Keep all follow-up visits. This is important. Medicines Take over-the-counter and prescription medicines only as told by your health care provider. Follow directions carefully. Blood  pressure medicines must be taken as prescribed. Do not skip doses of blood pressure medicine. Doing this puts you at risk for problems and can make the medicine less effective. Ask your health care provider about side effects or reactions to medicines that you should watch for. Contact a health care provider if you: Think you are having a reaction to a medicine you are taking. Have headaches that keep coming back (recurring). Feel dizzy. Have swelling in your ankles. Have trouble with your vision. Get help right away if you: Develop a severe headache or confusion. Have unusual weakness or numbness. Feel faint. Have severe pain in your chest or abdomen. Vomit repeatedly. Have trouble breathing. These symptoms may be an emergency. Get help right away. Call 911. Do not wait to see if the symptoms will go away. Do not drive yourself to the hospital. Summary Hypertension is when the force of blood pumping through your arteries is too strong. If this condition is not controlled, it may put you at risk for serious complications. Your personal target blood pressure may vary depending on your medical conditions, your age, and other factors. For most people, a normal blood pressure is less than 120/80. Hypertension is treated with lifestyle changes, medicines, or a combination of both. Lifestyle changes include losing weight, eating a healthy,  low-sodium diet, exercising more, and limiting alcohol. This information is not intended to replace advice given to you by your health care provider. Make sure you discuss any questions you have with your health care provider. Document Revised: 12/04/2020 Document Reviewed: 12/04/2020 Elsevier Patient Education  2024 [employer].

## 2023-07-30 ENCOUNTER — Ambulatory Visit: Payer: Self-pay | Admitting: Family Medicine

## 2023-07-30 ENCOUNTER — Ambulatory Visit (INDEPENDENT_AMBULATORY_CARE_PROVIDER_SITE_OTHER): Admitting: Family Medicine

## 2023-07-30 ENCOUNTER — Ambulatory Visit (INDEPENDENT_AMBULATORY_CARE_PROVIDER_SITE_OTHER)

## 2023-07-30 ENCOUNTER — Encounter: Payer: Self-pay | Admitting: Family Medicine

## 2023-07-30 VITALS — BP 148/80 | HR 81 | Ht 72.0 in | Wt 203.4 lb

## 2023-07-30 DIAGNOSIS — R635 Abnormal weight gain: Secondary | ICD-10-CM

## 2023-07-30 DIAGNOSIS — E663 Overweight: Secondary | ICD-10-CM | POA: Diagnosis not present

## 2023-07-30 DIAGNOSIS — I517 Cardiomegaly: Secondary | ICD-10-CM

## 2023-07-30 DIAGNOSIS — Z8711 Personal history of peptic ulcer disease: Secondary | ICD-10-CM | POA: Insufficient documentation

## 2023-07-30 DIAGNOSIS — R030 Elevated blood-pressure reading, without diagnosis of hypertension: Secondary | ICD-10-CM | POA: Insufficient documentation

## 2023-07-30 DIAGNOSIS — R198 Other specified symptoms and signs involving the digestive system and abdomen: Secondary | ICD-10-CM | POA: Insufficient documentation

## 2023-07-30 DIAGNOSIS — K5900 Constipation, unspecified: Secondary | ICD-10-CM | POA: Insufficient documentation

## 2023-07-30 DIAGNOSIS — I10 Essential (primary) hypertension: Secondary | ICD-10-CM

## 2023-07-30 DIAGNOSIS — Z8679 Personal history of other diseases of the circulatory system: Secondary | ICD-10-CM

## 2023-07-30 LAB — COMPREHENSIVE METABOLIC PANEL WITH GFR
ALT: 27 U/L (ref 0–53)
AST: 23 U/L (ref 0–37)
Albumin: 4.5 g/dL (ref 3.5–5.2)
Alkaline Phosphatase: 45 U/L (ref 39–117)
BUN: 13 mg/dL (ref 6–23)
CO2: 31 meq/L (ref 19–32)
Calcium: 9.6 mg/dL (ref 8.4–10.5)
Chloride: 105 meq/L (ref 96–112)
Creatinine, Ser: 0.84 mg/dL (ref 0.40–1.50)
GFR: 88.63 mL/min (ref >60.00)
Glucose, Bld: 92 mg/dL (ref 70–99)
Potassium: 3.7 meq/L (ref 3.5–5.1)
Sodium: 142 meq/L (ref 135–145)
Total Bilirubin: 0.4 mg/dL (ref 0.2–1.2)
Total Protein: 7.6 g/dL (ref 6.0–8.3)

## 2023-07-30 LAB — CBC WITH DIFFERENTIAL/PLATELET
Basophils Absolute: 0 10*3/uL (ref 0.0–0.1)
Basophils Relative: 0.5 % (ref 0.0–3.0)
Eosinophils Absolute: 0.2 10*3/uL (ref 0.0–0.7)
Eosinophils Relative: 2.5 % (ref 0.0–5.0)
HCT: 43 % (ref 39.0–52.0)
Hemoglobin: 14.8 g/dL (ref 13.0–17.0)
Lymphocytes Relative: 39.5 % (ref 12.0–46.0)
Lymphs Abs: 3.5 10*3/uL (ref 0.7–4.0)
MCHC: 34.4 g/dL (ref 30.0–36.0)
MCV: 85.6 fl (ref 78.0–100.0)
Monocytes Absolute: 0.7 10*3/uL (ref 0.1–1.0)
Monocytes Relative: 7.4 % (ref 3.0–12.0)
Neutro Abs: 4.5 10*3/uL (ref 1.4–7.7)
Neutrophils Relative %: 50.1 % (ref 43.0–77.0)
Platelets: 341 10*3/uL (ref 150.0–400.0)
RBC: 5.03 Mil/uL (ref 4.22–5.81)
RDW: 13.5 % (ref 11.5–15.5)
WBC: 8.9 10*3/uL (ref 4.0–10.5)

## 2023-07-30 LAB — LIPID PANEL
Cholesterol: 168 mg/dL (ref 0–200)
HDL: 62.4 mg/dL (ref >39.00)
LDL Cholesterol: 79 mg/dL (ref 0–99)
NonHDL: 105.46
Total CHOL/HDL Ratio: 3
Triglycerides: 132 mg/dL (ref 0.0–149.0)
VLDL: 26.4 mg/dL (ref 0.0–40.0)

## 2023-07-30 LAB — T4, FREE: Free T4: 0.8 ng/dL (ref 0.60–1.60)

## 2023-07-30 LAB — TSH: TSH: 0.82 u[IU]/mL (ref 0.35–5.50)

## 2023-07-30 NOTE — Progress Notes (Signed)
His labs are all normal

## 2023-07-30 NOTE — Patient Instructions (Signed)
 Please go downstairs for labs and a chest x-ray before you leave.  I will be in touch with your results and with recommendations.  I will send in a medication for potentially 2 medications to help lower your blood pressure.  Monitor your blood pressure at home.  Goal blood pressure readings are 130/80 or lower.  Reduce your sodium/salt intake.  See the DASH diet handout  Bring in your blood pressure machine and your readings and follow-up with me in 4 weeks.

## 2023-07-30 NOTE — Progress Notes (Signed)
 His chest x-ray is negative.  This is good news.

## 2023-07-30 NOTE — Progress Notes (Unsigned)
 New Patient Office Visit  Subjective    Patient ID: Calvin [name], male    DOB: [birth year]  Age: 68 y.o. MRN: [id number]  CC:  Chief Complaint  Patient presents with   Establish Care    HPI Calvin [name] presents to establish care Previous PCP years ago, in 2018 or 2019    Urologist - Dr. Estanislao Heimlich at [facility] [place] yesterday   HTN - new problem. BP elevated at recent urology visits.  Never been on medication. Denies chest pain, palpitations, DOE, edema.      Denies smoking, alcohol and drug use   Gained 20 lbs   Hx of stomach ulcers and GERD. Requests referral to GI.  Changes in shape of stool and more constipation recently.   Lives alone  Retired  Former Paramedic      07/30/2023    2:45 PM  Depression screen PHQ 2/9  Decreased Interest 1  Down, Depressed, Hopeless 0  PHQ - 2 Score 1  Altered sleeping 0  Tired, decreased energy 1  Change in appetite 0  Feeling bad or failure about yourself  0  Trouble concentrating 1  Moving slowly or fidgety/restless 0  Suicidal thoughts 0  PHQ-9 Score 3  Difficult doing work/chores Not difficult at all     Outpatient Encounter Medications as of 07/30/2023  Medication Sig   olmesartan-hydrochlorothiazide (BENICAR HCT) 20-12.5 MG tablet Take 1 tablet by mouth daily.   No facility-administered encounter medications on file as of 07/30/2023.    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   Elevated PSA    GERD (gastroesophageal reflux disease)    Paraphimosis    Urinary retention     Past Surgical History:  Procedure Laterality Date   COLONOSCOPY     CYSTOSCOPY  06/14/2021   in office   HOLEP-LASER ENUCLEATION OF THE PROSTATE WITH MORCELLATION N/A 08/23/2021   Procedure: HOLEP-LASER ENUCLEATION OF THE PROSTATE WITH MORCELLATION;  Surgeon: Lawerence Pressman, MD;  Location: [facility] ORS;  Service: Urology;  Laterality: N/A;    UPPER GI ENDOSCOPY     WISDOM TOOTH EXTRACTION  1983    Family History  Problem Relation Age of Onset   Heart failure Mother     Social History   Socioeconomic History   Marital status: Single    Spouse name: Not on file   Number of children: 0   Years of education: Not on file   Highest education level: Not on file  Occupational History   Not on file  Tobacco Use   Smoking status: Never    Passive exposure: Never   Smokeless tobacco: Never  Vaping Use   Vaping status: Never Used  Substance and Sexual Activity   Alcohol use: No    Alcohol/week: 0.0 standard drinks of alcohol   Drug use: No   Sexual activity: Not on file  Other Topics Concern   Not on file  Social History Narrative   Occupation employed , Radiographer, 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   Social Drivers of Corporate investment banker Strain: Not on file  Food Insecurity: Not on file  Transportation Needs: Not on file  Physical Activity: Not on file  Stress: Not on file  Social Connections: Not on file  Intimate Partner Violence: Not on file    Review of Systems  Constitutional:  Negative for chills, diaphoresis, fever, malaise/fatigue and weight loss.  Eyes:  Negative for blurred vision and double vision.  Respiratory:  Negative for cough and shortness of breath.   Cardiovascular:  Negative for chest pain, palpitations, orthopnea, claudication and leg swelling.  Gastrointestinal:  Positive for constipation. Negative for abdominal pain, blood in stool, diarrhea, nausea and vomiting.  Genitourinary:  Negative for dysuria, frequency and urgency.  Musculoskeletal:  Negative for falls.  Neurological:  Negative for dizziness, focal weakness and headaches.  Psychiatric/Behavioral:  Negative for depression and substance abuse. The patient is not nervous/anxious.         Objective    BP (!) 148/80 Comment: Repeat BP  Pulse 81   Ht 6' (1.829 m)   Wt 203 lb 6.4 oz (92.3 kg)   SpO2 98%   BMI 27.59 kg/m   Physical  Exam Constitutional:      General: He is not in acute distress.    Appearance: He is not ill-appearing.  HENT:     Mouth/Throat:     Mouth: Mucous membranes are moist.     Pharynx: Oropharynx is clear.   Eyes:     Extraocular Movements: Extraocular movements intact.     Conjunctiva/sclera: Conjunctivae normal.    Cardiovascular:     Rate and Rhythm: Normal rate and regular rhythm.  Pulmonary:     Effort: Pulmonary effort is normal.     Breath sounds: Normal breath sounds.   Musculoskeletal:     Cervical back: Normal range of motion and neck supple. No tenderness.     Right lower leg: No edema.     Left lower leg: No edema.  Lymphadenopathy:     Cervical: No cervical adenopathy.   Skin:    General: Skin is warm and dry.   Neurological:     General: No focal deficit present.     Mental Status: He is alert and oriented to person, place, and time.     Cranial Nerves: No cranial nerve deficit.     Motor: No weakness.     Coordination: Coordination normal.     Gait: Gait normal.   Psychiatric:        Mood and Affect: Mood normal.        Behavior: Behavior normal.        Thought Content: Thought content normal.         Assessment & Plan:   Problem List Items Addressed This Visit     Change in bowel movement   Relevant Orders   Ambulatory referral to Gastroenterology   Constipation   Relevant Orders   TSH (Completed)   T4, free (Completed)   Ambulatory referral to Gastroenterology   Elevated blood pressure reading in office without diagnosis of hypertension   Relevant Orders   CBC with Differential/Platelet (Completed)   Comprehensive metabolic panel with GFR (Completed)   EKG 12-Lead   History of stomach ulcers   Relevant Orders   Ambulatory referral to Gastroenterology   LVH (left ventricular hypertrophy)   Relevant Medications   olmesartan-hydrochlorothiazide (BENICAR HCT) 20-12.5 MG tablet   Other Relevant Orders   DG Chest 2 View (Completed)    Ambulatory referral to Cardiology   Other Visit Diagnoses       Hypertension, unspecified type    -  Primary   Relevant Medications   olmesartan-hydrochlorothiazide (BENICAR HCT) 20-12.5 MG tablet   Other Relevant Orders   CBC with Differential/Platelet (Completed)   Comprehensive metabolic panel with GFR (Completed)   Lipid panel (Completed)   EKG 12-Lead   DG  Chest 2 View (Completed)   Ambulatory referral to Cardiology     Recent weight gain       Relevant Orders   DG Chest 2 View (Completed)     Overweight (BMI 25.0-29.9)       Relevant Orders   CBC with Differential/Platelet (Completed)   Comprehensive metabolic panel with GFR (Completed)   Lipid panel (Completed)     History of atrial fibrillation       Relevant Orders   Ambulatory referral to Cardiology      He is here to establish care.  No recent medical care except for urologist. New finding of hypertension. Check labs to look for secondary causes.  Check renal function.  Start olmesartan HCTZ if renal function permits. Chest X ray ordered due to new cardiac findings and recent weight gain.   EKG shows NSR, rate 71, LVH which was not seen in 2015 when he last saw cardiology.  He was lost to follow up with cardiology.  I will refer him back as requested.  Discussed getting HTN under control by medication, low sodium diet and increasing exercise.  Monitor BP at home.  Follow up here in 4 weeks with BP readings.   Discussed getting fiber in diet, hydration and Miralax for constipation.  Referral to GI for further work up. He thinks he is also due for his next colonoscopy.     Return in about 4 weeks (around 08/27/2023) for Fasting follow up, HTN, choleseterol .   Alyson Back, NP-C

## 2023-07-31 MED ORDER — OLMESARTAN MEDOXOMIL-HCTZ 20-12.5 MG PO TABS: 1.0000 | ORAL_TABLET | Freq: Every day | ORAL | 0 refills | Status: DC

## 2023-08-05 ENCOUNTER — Encounter: Payer: Self-pay | Admitting: Gastroenterology

## 2023-08-27 ENCOUNTER — Ambulatory Visit: Admitting: Family Medicine

## 2023-08-27 ENCOUNTER — Ambulatory Visit: Payer: Self-pay | Admitting: Family Medicine

## 2023-08-27 ENCOUNTER — Encounter: Payer: Self-pay | Admitting: Family Medicine

## 2023-08-27 ENCOUNTER — Other Ambulatory Visit: Payer: Self-pay | Admitting: Family Medicine

## 2023-08-27 VITALS — BP 130/84 | HR 82 | Temp 97.6°F | Ht 72.0 in | Wt 204.0 lb

## 2023-08-27 DIAGNOSIS — I517 Cardiomegaly: Secondary | ICD-10-CM | POA: Diagnosis not present

## 2023-08-27 DIAGNOSIS — E663 Overweight: Secondary | ICD-10-CM

## 2023-08-27 DIAGNOSIS — I10 Essential (primary) hypertension: Secondary | ICD-10-CM | POA: Diagnosis not present

## 2023-08-27 LAB — BASIC METABOLIC PANEL WITH GFR
BUN: 16 mg/dL (ref 6–23)
CO2: 27 meq/L (ref 19–32)
Calcium: 9.5 mg/dL (ref 8.4–10.5)
Chloride: 104 meq/L (ref 96–112)
Creatinine, Ser: 1.04 mg/dL (ref 0.40–1.50)
GFR: 72.93 mL/min (ref >60.00)
Glucose, Bld: 156 mg/dL — ABNORMAL HIGH (ref 70–99)
Potassium: 3.4 meq/L — ABNORMAL LOW (ref 3.5–5.1)
Sodium: 140 meq/L (ref 135–145)

## 2023-08-27 MED ORDER — POTASSIUM CHLORIDE CRYS ER 10 MEQ PO TBCR: 10.0000 meq | EXTENDED_RELEASE_TABLET | Freq: Every day | ORAL | 2 refills | Status: DC

## 2023-08-27 NOTE — Patient Instructions (Signed)
 If you do not hear from the cardiology office to schedule an appointment in the next 1 to 2 weeks, please call [phone] to make an appointment.

## 2023-08-27 NOTE — Progress Notes (Signed)
 Please let him know that his potassium is slightly low and this is due to the blood pressure medication.  I will send in a daily potassium supplement for him to take.

## 2023-08-27 NOTE — Progress Notes (Signed)
 Subjective:     Patient ID: Calvin [name], male    DOB: Nov 26, 1955, 68 y.o.   MRN: [id number]  Chief Complaint  Patient presents with   Medical Management of Chronic Issues    4 week f/u for HTN and cholesterol     HPI  History of Present Illness          Is here for follow-up of chronic health conditions.  This is his second visit with me.  Fairly new diagnosis of hypertension.  Started on medication, olmesartan  hydrochlorothiazide 20-12.5 mg daily on 07/30/2023.  Reports good compliance with the medication.  Reports eating a low-sodium diet. BP at home has been improving   He had a normal chest x-ray and renal function.  EKG showed LVH. He was lost to follow-up with cardiology so I referred him back last month.  He had noted weight gain of approximately 20 pounds at his previous visit.   Referred him to GI for history of stomach ulcers and GERD as well as stool changes.  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   Elevated PSA    GERD (gastroesophageal reflux disease)    Paraphimosis    Urinary retention     Past Surgical History:  Procedure Laterality Date   COLONOSCOPY     CYSTOSCOPY  06/14/2021   in office   HOLEP-LASER ENUCLEATION OF THE PROSTATE WITH MORCELLATION N/A 08/23/2021   Procedure: HOLEP-LASER ENUCLEATION OF THE PROSTATE WITH MORCELLATION;  Surgeon: Francisca Redell BROCKS, MD;  Location: [facility] ORS;  Service: Urology;  Laterality: N/A;   UPPER GI ENDOSCOPY     WISDOM TOOTH EXTRACTION  1983    Family  History  Problem Relation Age of Onset   Heart failure Mother     Social History   Socioeconomic History   Marital status: Single    Spouse name: Not on file   Number of children: 0   Years of education: Not on file   Highest education level: Not on file  Occupational History   Not on file  Tobacco Use   Smoking status: Never    Passive exposure: Never   Smokeless tobacco: Never  Vaping Use   Vaping status: Never Used  Substance and Sexual Activity   Alcohol use: No    Alcohol/week: 0.0 standard drinks of alcohol   Drug use: No   Sexual activity: Not on file  Other Topics Concern   Not on file  Social History Narrative   Occupation employed , Radiographer, 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   Social Drivers of Corporate investment banker Strain: Not on file  Food Insecurity: Not on file  Transportation Needs: Not on file  Physical Activity: Not on file  Stress: Not on file  Social Connections: Not on file  Intimate Partner Violence: Not on file    Outpatient Medications Prior to Visit  Medication Sig Dispense Refill   olmesartan -hydrochlorothiazide (BENICAR  HCT) 20-12.5 MG tablet Take 1  tablet by mouth daily. 90 tablet 0   No facility-administered medications prior to visit.    Allergies  Allergen Reactions   Penicillins Rash    Review of Systems  Constitutional:  Negative for chills, fever and malaise/fatigue.  Respiratory:  Negative for shortness of breath.   Cardiovascular:  Negative for chest pain, palpitations and leg swelling.  Gastrointestinal:  Negative for abdominal pain, constipation, diarrhea, nausea and vomiting.  Genitourinary:  Negative for dysuria, frequency and urgency.  Neurological:  Negative for dizziness, focal weakness and headaches.  All other systems reviewed and are negative.      Objective:    Physical Exam Constitutional:      General: He is not in acute distress.    Appearance: He is not ill-appearing.  Eyes:     Extraocular Movements: Extraocular  movements intact.     Conjunctiva/sclera: Conjunctivae normal.  Cardiovascular:     Rate and Rhythm: Normal rate and regular rhythm.  Pulmonary:     Effort: Pulmonary effort is normal.     Breath sounds: Normal breath sounds.  Musculoskeletal:     Cervical back: Normal range of motion and neck supple.     Right lower leg: No edema.     Left lower leg: No edema.  Skin:    General: Skin is warm and dry.  Neurological:     General: No focal deficit present.     Mental Status: He is alert and oriented to person, place, and time.     Motor: No weakness.     Coordination: Coordination normal.     Gait: Gait normal.  Psychiatric:        Mood and Affect: Mood normal.        Behavior: Behavior normal.        Thought Content: Thought content normal.      BP 130/84 (BP Location: Left Arm, Patient Position: Sitting)   Pulse 82   Temp 97.6 F (36.4 C) (Temporal)   Ht 6' (1.829 m)   Wt 204 lb (92.5 kg)   SpO2 98%   BMI 27.67 kg/m  Wt Readings from Last 3 Encounters:  08/27/23 204 lb (92.5 kg)  07/30/23 203 lb 6.4 oz (92.3 kg)  05/07/23 209 lb (94.8 kg)       Assessment & Plan:   Problem List Items Addressed This Visit     LVH (left ventricular hypertrophy)   Other Visit Diagnoses       Primary hypertension    -  Primary   Relevant Orders   Basic metabolic panel with GFR     Overweight (BMI 25.0-29.9)          Blood pressure is improving.  Continue current medication and low-sodium diet.  Increase physical activity. Reviewed labs from previous visit.  Cholesterol controlled. Upcoming appointment with GI. Referred back to cardiology since he was lost to follow-up and recent EKG with new findings Follow-up here for fasting CPE in 3 months.  I am having Toribio DOROTHA Rima maintain his olmesartan -hydrochlorothiazide.  No orders of the defined types were placed in this encounter.

## 2023-09-07 ENCOUNTER — Ambulatory Visit (INDEPENDENT_AMBULATORY_CARE_PROVIDER_SITE_OTHER)

## 2023-09-07 ENCOUNTER — Other Ambulatory Visit (INDEPENDENT_AMBULATORY_CARE_PROVIDER_SITE_OTHER)

## 2023-09-07 VITALS — BP 138/68 | HR 77 | Ht 72.0 in | Wt 205.4 lb

## 2023-09-07 DIAGNOSIS — Z1159 Encounter for screening for other viral diseases: Secondary | ICD-10-CM

## 2023-09-07 DIAGNOSIS — Z23 Encounter for immunization: Secondary | ICD-10-CM

## 2023-09-07 DIAGNOSIS — Z0000 Encounter for general adult medical examination without abnormal findings: Secondary | ICD-10-CM | POA: Diagnosis not present

## 2023-09-07 DIAGNOSIS — Z1211 Encounter for screening for malignant neoplasm of colon: Secondary | ICD-10-CM

## 2023-09-07 NOTE — Patient Instructions (Signed)
 Mr. Calvin [name] , Thank you for taking time out of your busy schedule to complete your Annual Wellness Visit with me. I enjoyed our conversation and look forward to speaking with you again next year. I, as well as your care team,  appreciate your ongoing commitment to your health goals. Please review the following plan we discussed and let me know if I can assist you in the future. Your Game plan/ To Do List    Referrals: If you haven't heard from the office you've been referred to, please reach out to them at the phone provided.  Ordered a Hepatitis C Screening (Lab) Follow up Visits: Next Medicare AWV with our clinical staff: 09/07/2024   Have you seen your provider in the last 6 months (3 months if uncontrolled diabetes)? No Next Office Visit with your provider: 11/2023  Clinician Recommendations:  Aim for 30 minutes of exercise or brisk walking, 6-8 glasses of water, and 5 servings of fruits and vegetables each day. Educated and advised on getting the Tdap and Shingles.      This is a list of the screening recommended for you and due dates:  Health Maintenance  Topic Date Due   Hepatitis C Screening  Never done   DTaP/Tdap/Td vaccine (1 - Tdap) Never done   Colon Cancer Screening  Never done   Pneumococcal Vaccine for age over 82 (1 of 1 - PCV) Never done   Zoster (Shingles) Vaccine (1 of 2) Never done   COVID-19 Vaccine (3 - 2024-25 season) 10/12/2022   Flu Shot  09/11/2023   Medicare Annual Wellness Visit  09/06/2024   Hepatitis B Vaccine  Aged Out   HPV Vaccine  Aged Out   Meningitis B Vaccine  Aged Out    Advanced directives: (Provided) Advance directive discussed with you today. I have provided a copy for you to complete at home and have notarized. Once this is complete, please bring a copy in to our office so we can scan it into your chart.  Advance Care Planning is important because it:  [x]  Makes sure you receive the medical care that is consistent with your values, goals, and  preferences  [x]  It provides guidance to your family and loved ones and reduces their decisional burden about whether or not they are making the right decisions based on your wishes.  Follow the link provided in your after visit summary or read over the paperwork we have mailed to you to help you started getting your Advance Directives in place. If you need assistance in completing these, please reach out to us  so that we can help you!

## 2023-09-07 NOTE — Progress Notes (Addendum)
 Subjective:  Please attest and cosign this visit due to patients primary care provider not being in the office at the time the visit was completed.  (Pt of Boby Mackintosh, NP)   Calvin [name] is a 68 y.o. who presents for a Medicare Wellness preventive visit.  As a reminder, Annual Wellness Visits don't include a physical exam, and some assessments may be limited, especially if this visit is performed virtually. We may recommend an in-person follow-up visit with your provider if needed.  Visit Complete: In person  Persons Participating in Visit: Patient.  AWV Questionnaire: No: Patient Medicare AWV questionnaire was not completed prior to this visit.  Cardiac Risk Factors include: advanced age (>42men, >40 women);Other (see comment), Risk factor comments: dx-elevated BP but not HTN     Objective:    Today's Vitals   09/07/23 0817  BP: 138/68  Pulse: 77  Weight: 205 lb 6.4 oz (93.2 kg)  Height: 6' (1.829 m)   Body mass index is 27.86 kg/m.     09/07/2023    8:17 AM 08/23/2021    7:35 AM 08/02/2021    9:24 AM  Advanced Directives  Does Patient Have a Medical Advance Directive? No No No  Would patient like information on creating a medical advance directive? Yes (MAU/Ambulatory/Procedural Areas - Information given) No - Patient declined No - Patient declined    Current Medications (verified) Outpatient Encounter Medications as of 09/07/2023  Medication Sig   olmesartan -hydrochlorothiazide (BENICAR  HCT) 20-12.5 MG tablet Take 1 tablet by mouth daily.   potassium chloride  (KLOR-CON  M) 10 MEQ tablet Take 1 tablet (10 mEq total) by mouth daily.   No facility-administered encounter medications on file as of 09/07/2023.    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   Elevated PSA    GERD (gastroesophageal reflux disease)    Paraphimosis    Urinary retention    Past  Surgical History:  Procedure Laterality Date   COLONOSCOPY     CYSTOSCOPY  06/14/2021   in office   HOLEP-LASER ENUCLEATION OF THE PROSTATE WITH MORCELLATION N/A 08/23/2021   Procedure: HOLEP-LASER ENUCLEATION OF THE PROSTATE WITH MORCELLATION;  Surgeon: Francisca Redell BROCKS, MD;  Location: [facility] ORS;  Service: Urology;  Laterality: N/A;   UPPER GI ENDOSCOPY     WISDOM TOOTH EXTRACTION  1983   Family History  Problem Relation Age of Onset   Heart failure Mother    Social History   Socioeconomic History   Marital status: Single    Spouse name: Not on file   Number of children: 0   Years of education: Not on file   Highest education level: Not on file  Occupational History   Not on file  Tobacco Use   Smoking status: Never    Passive exposure: Never   Smokeless tobacco: Never  Vaping Use   Vaping status: Never Used  Substance and Sexual Activity   Alcohol use: No    Alcohol/week: 0.0 standard drinks of alcohol   Drug use: No   Sexual activity: Yes  Other Topics Concern   Not on file  Social History Narrative   Occupation employed , Radiographer, 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   Social Drivers of Health   Financial Resource Strain: Low Risk  (09/07/2023)   Overall Financial Resource Strain (CARDIA)    Difficulty of Paying Living Expenses: Not hard at all  Food Insecurity: No Food Insecurity (  09/07/2023)   Hunger Vital Sign    Worried About Running Out of Food in the Last Year: Never true    Ran Out of Food in the Last Year: Never true  Transportation Needs: No Transportation Needs (09/07/2023)   PRAPARE - Administrator, Civil Service (Medical): No    Lack of Transportation (Non-Medical): No  Physical Activity: Sufficiently Active (09/07/2023)   Exercise Vital Sign    Days of Exercise per Week: 5 days    Minutes of Exercise per Session: 120 min  Stress: No Stress Concern Present (09/07/2023)   [employer] of Occupational Health - Occupational Stress Questionnaire    Feeling of  Stress: Not at all  Social Connections: Socially Isolated (09/07/2023)   Social Connection and Isolation Panel    Frequency of Communication with Friends and Family: More than three times a week    Frequency of Social Gatherings with Friends and Family: More than three times a week    Attends Religious Services: Never    Database administrator or Organizations: No    Attends Engineer, structural: Never    Marital Status: Never married    Tobacco Counseling Counseling given: No    Clinical Intake:  Pre-visit preparation completed: Yes  Pain : No/denies pain     BMI - recorded: 27.86 Nutritional Status: BMI 25 -29 Overweight Nutritional Risks: None Diabetes: No  No results found for: HGBA1C   How often do you need to have someone help you when you read instructions, pamphlets, or other written materials from your doctor or pharmacy?: 1 - Never  Interpreter Needed?: No  Information entered by :: Calvin [name], CMA   Activities of Daily Living     09/07/2023    8:20 AM  In your present state of health, do you have any difficulty performing the following activities:  Hearing? 0  Vision? 0  Difficulty concentrating or making decisions? 0  Walking or climbing stairs? 0  Dressing or bathing? 0  Doing errands, shopping? 0  Preparing Food and eating ? N  Using the Toilet? N  In the past six months, have you accidently leaked urine? Y  Comment wears depends  Do you have problems with loss of bowel control? N  Managing your Medications? N  Managing your Finances? N  Housekeeping or managing your Housekeeping? N    Patient Care Team: Lendia Boby CROME, NP-C as PCP - General (Family Medicine)  I have updated your Care Teams any recent Medical Services you may have received from other providers in the past year.     Assessment:   This is a routine wellness examination for Calvin [name].  Hearing/Vision screen Hearing Screening - Comments:: Denies hearing  difficulties   Vision Screening - Comments:: Wears rx glasses - up to date with routine eye exams with [facility]   Goals Addressed               This Visit's Progress     Patient Stated (pt-stated)        Patient stated that he's monitoring his BP readings and exercising       Depression Screen     09/07/2023    8:21 AM 07/30/2023    2:45 PM  PHQ 2/9 Scores  PHQ - 2 Score 0 1  PHQ- 9 Score 0 3    Fall Risk     09/07/2023    8:21 AM 07/30/2023    2:45 PM  Fall  Risk   Falls in the past year? 0 0  Number falls in past yr: 0 0  Injury with Fall? 0 0  Risk for fall due to : No Fall Risks   Follow up Falls evaluation completed;Falls prevention discussed     MEDICARE RISK AT HOME:  Medicare Risk at Home Any stairs in or around the home?: No If so, are there any without handrails?: No Home free of loose throw rugs in walkways, pet beds, electrical cords, etc?: Yes Adequate lighting in your home to reduce risk of falls?: Yes Life alert?: No Use of a cane, walker or w/c?: No Grab bars in the bathroom?: No Shower chair or bench in shower?: No Elevated toilet seat or a handicapped toilet?: No  TIMED UP AND GO:  Was the test performed?  No  Cognitive Function: 6CIT completed        09/07/2023    8:23 AM  6CIT Screen  What Year? 0 points  What month? 0 points  What time? 0 points  Count back from 20 0 points  Months in reverse 0 points  Repeat phrase 0 points  Total Score 0 points    Immunizations Immunization History  Administered Date(s) Administered   PFIZER(Purple Top)SARS-COV-2 Vaccination 04/10/2019, 05/04/2019   PNEUMOCOCCAL CONJUGATE-20 09/07/2023    Screening Tests Health Maintenance  Topic Date Due   Hepatitis C Screening  Never done   DTaP/Tdap/Td (1 - Tdap) Never done   Colonoscopy  Never done   Zoster Vaccines- Shingrix (1 of 2) Never done   COVID-19 Vaccine (3 - 2024-25 season) 10/12/2022   INFLUENZA VACCINE  09/11/2023   Medicare  Annual Wellness (AWV)  09/06/2024   Pneumococcal Vaccine: 50+ Years  Completed   Hepatitis B Vaccines  Aged Out   HPV VACCINES  Aged Out   Meningococcal B Vaccine  Aged Out    Health Maintenance  Health Maintenance Due  Topic Date Due   Hepatitis C Screening  Never done   DTaP/Tdap/Td (1 - Tdap) Never done   Colonoscopy  Never done   Zoster Vaccines- Shingrix (1 of 2) Never done   COVID-19 Vaccine (3 - 2024-25 season) 10/12/2022   Health Maintenance Items Addressed:  Pneumovax vaccine given, Labs Ordered: Ordered a Hepatitis C Screening; Ordered a Screening Colonoscopy  Additional Screening:  Vision Screening: Recommended annual ophthalmology exams for early detection of glaucoma and other disorders of the eye. Would you like a referral to an eye doctor? No  Patient stated he had an eye exam w/[facility] in 07/2023.  Dental Screening: Recommended annual dental exams for proper oral hygiene  Community Resource Referral / Chronic Care Management: CRR required this visit?  No   CCM required this visit?  No   Plan:    I have personally reviewed and noted the following in the patient's chart:   Medical and social history Use of alcohol, tobacco or illicit drugs  Current medications and supplements including opioid prescriptions. Patient is not currently taking opioid prescriptions. Functional ability and status Nutritional status Physical activity Advanced directives List of other physicians Hospitalizations, surgeries, and ER visits in previous 12 months Vitals Screenings to include cognitive, depression, and falls Referrals and appointments  In addition, I have reviewed and discussed with patient certain preventive protocols, quality metrics, and best practice recommendations. A written personalized care plan for preventive services as well as general preventive health recommendations were provided to patient.   Calvin CHRISTELLA [name], CMA   09/07/2023  After Visit  Summary: (MyChart) Due to this being a telephonic visit, the after visit summary with patients personalized plan was offered to patient via MyChart   Notes: Nothing significant to report at this time.

## 2023-09-08 LAB — HEPATITIS C ANTIBODY: Hepatitis C Ab: NONREACTIVE

## 2023-09-09 ENCOUNTER — Ambulatory Visit: Payer: Self-pay | Admitting: Family Medicine

## 2023-09-09 NOTE — Progress Notes (Signed)
 Negative hepatitis C test result as expected.

## 2023-09-28 NOTE — Progress Notes (Unsigned)
 Chief Complaint: Constipation, history of stomach ulcers, colon cancer screening Primary GI Doctor: Dr. Leigh  HPI:  Patient is a 68year old male/male patient with past medical history of GERD, BPH, who was referred to me by Lendia Boby CROME, NP-C on 07/31/2023 for a evaluation of constipation, history of stomach ulcers, colon cancer screening.    Interval History  Patient admits/denies GERD Patient admits/denies dysphagia Patient admits/denies nausea, vomiting, or weight loss  Patient admits/denies altered bowel habits Patient admits/denies abdominal pain Patient admits/denies rectal bleeding   Denies/Admits alcohol Denies/Admits smoking Denies/Admits NSAID use. Denies/Admits they are on blood thinners.  Patients last colonoscopy Patients last EGD  Surgical history:  Patient's family history includes  Wt Readings from Last 3 Encounters:  09/07/23 205 lb 6.4 oz (93.2 kg)  08/27/23 204 lb (92.5 kg)  07/30/23 203 lb 6.4 oz (92.3 kg)      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   Elevated PSA    GERD (gastroesophageal reflux disease)    Paraphimosis    Urinary retention     Past Surgical History:  Procedure Laterality Date   COLONOSCOPY     CYSTOSCOPY  06/14/2021   in office   HOLEP-LASER ENUCLEATION OF THE PROSTATE WITH MORCELLATION N/A 08/23/2021   Procedure: HOLEP-LASER ENUCLEATION OF THE PROSTATE WITH MORCELLATION;  Surgeon: Francisca Redell BROCKS, MD;  Location: [facility] ORS;  Service: Urology;  Laterality: N/A;   UPPER GI ENDOSCOPY     WISDOM TOOTH EXTRACTION  1983    Current Outpatient Medications  Medication Sig Dispense Refill   olmesartan -hydrochlorothiazide (BENICAR  HCT) 20-12.5 MG tablet Take 1 tablet by mouth daily. 90 tablet 0   potassium chloride  (KLOR-CON  M) 10 MEQ tablet Take 1 tablet (10 mEq total) by mouth daily. 30 tablet 2   No current facility-administered medications for this  visit.    Allergies as of 09/29/2023 - Review Complete 09/07/2023  Allergen Reaction Noted   Penicillins Rash 02/01/2014    Family History  Problem Relation Age of Onset   Heart failure Mother     Review of Systems:    Constitutional: No weight loss, fever, chills, weakness or fatigue HEENT: Eyes: No change in vision               Ears, Nose, Throat:  No change in hearing or congestion Skin: No rash or itching Cardiovascular: No chest pain, chest pressure or palpitations   Respiratory: No SOB or cough Gastrointestinal: See HPI and otherwise negative Genitourinary: No dysuria or change in urinary frequency Neurological: No headache, dizziness or syncope Musculoskeletal: No new muscle or joint pain Hematologic: No bleeding or bruising Psychiatric: No history of depression or anxiety    Physical Exam:  Vital signs: There were no vitals taken for this visit.  Constitutional:   Pleasant *** male/male appears to be in NAD, Well developed, Well nourished, alert and cooperative Eyes:   PEERL, EOMI. No icterus. Conjunctiva pink. Neck:  Supple Throat: Oral cavity and pharynx without inflammation, swelling or lesion.  Respiratory: Respirations even and unlabored. Lungs clear to auscultation bilaterally.   No wheezes, crackles, or rhonchi.  Cardiovascular: Normal S1, S2. Regular rate and rhythm. No peripheral edema, cyanosis or pallor.  Gastrointestinal:  Soft, nondistended, nontender. No rebound or guarding. Normal bowel sounds. No appreciable masses or hepatomegaly. Rectal:  Not performed.  Anoscopy: Msk:  Symmetrical without gross deformities. Without edema, no deformity or joint abnormality.  Neurologic:  Alert and  oriented x4;  grossly normal neurologically.  Skin:   Dry and intact without significant lesions or rashes.  RELEVANT LABS AND IMAGING: CBC    Latest Ref Rng & Units 07/30/2023    3:47 PM 08/02/2021   11:15 AM  CBC  WBC 4.0 - 10.5 K/uL 8.9  8.0   Hemoglobin  13.0 - 17.0 g/dL 85.1  86.1   Hematocrit 39.0 - 52.0 % 43.0  41.9   Platelets 150.0 - 400.0 K/uL 341.0  385      CMP     Latest Ref Rng & Units 08/27/2023   10:21 AM 07/30/2023    3:47 PM 08/02/2021   11:15 AM  CMP  Glucose 70 - 99 mg/dL 843  92  896   BUN 6 - 23 mg/dL 16  13  12    Creatinine 0.40 - 1.50 mg/dL 8.95  9.15  9.09   Sodium 135 - 145 mEq/L 140  142  140   Potassium 3.5 - 5.1 mEq/L 3.4  3.7  3.8   Chloride 96 - 112 mEq/L 104  105  108   CO2 19 - 32 mEq/L 27  31  26    Calcium 8.4 - 10.5 mg/dL 9.5  9.6  9.3   Total Protein 6.0 - 8.3 g/dL  7.6    Total Bilirubin 0.2 - 1.2 mg/dL  0.4    Alkaline Phos 39 - 117 U/L  45    AST 0 - 37 U/L  23    ALT 0 - 53 U/L  27       Lab Results  Component Value Date   TSH 0.82 07/30/2023     Assessment: 1. ***  Plan: 1. ***   Thank you for the courtesy of this consult. Please call me with any questions or concerns.   Alaysiah Browder, FNP-C [facility] 09/28/2023, 4:33 PM  Cc: Henson, Vickie L, NP-C

## 2023-09-29 ENCOUNTER — Ambulatory Visit (INDEPENDENT_AMBULATORY_CARE_PROVIDER_SITE_OTHER): Admitting: Gastroenterology

## 2023-09-29 ENCOUNTER — Other Ambulatory Visit (INDEPENDENT_AMBULATORY_CARE_PROVIDER_SITE_OTHER)

## 2023-09-29 ENCOUNTER — Ambulatory Visit: Payer: Self-pay | Admitting: Gastroenterology

## 2023-09-29 ENCOUNTER — Encounter: Payer: Self-pay | Admitting: Gastroenterology

## 2023-09-29 VITALS — BP 114/60 | HR 90 | Ht 72.0 in | Wt 203.2 lb

## 2023-09-29 DIAGNOSIS — Z8711 Personal history of peptic ulcer disease: Secondary | ICD-10-CM

## 2023-09-29 DIAGNOSIS — Z860100 Personal history of colon polyps, unspecified: Secondary | ICD-10-CM

## 2023-09-29 DIAGNOSIS — K921 Melena: Secondary | ICD-10-CM

## 2023-09-29 DIAGNOSIS — K5909 Other constipation: Secondary | ICD-10-CM

## 2023-09-29 DIAGNOSIS — R140 Abdominal distension (gaseous): Secondary | ICD-10-CM

## 2023-09-29 LAB — COMPREHENSIVE METABOLIC PANEL WITH GFR
ALT: 26 U/L (ref 0–53)
AST: 19 U/L (ref 0–37)
Albumin: 4.4 g/dL (ref 3.5–5.2)
Alkaline Phosphatase: 44 U/L (ref 39–117)
BUN: 17 mg/dL (ref 6–23)
CO2: 30 meq/L (ref 19–32)
Calcium: 9.6 mg/dL (ref 8.4–10.5)
Chloride: 103 meq/L (ref 96–112)
Creatinine, Ser: 1.02 mg/dL (ref 0.40–1.50)
GFR: 74.61 mL/min (ref >60.00)
Glucose, Bld: 177 mg/dL — ABNORMAL HIGH (ref 70–99)
Potassium: 3.4 meq/L — ABNORMAL LOW (ref 3.5–5.1)
Sodium: 141 meq/L (ref 135–145)
Total Bilirubin: 0.5 mg/dL (ref 0.2–1.2)
Total Protein: 7.3 g/dL (ref 6.0–8.3)

## 2023-09-29 LAB — CBC WITH DIFFERENTIAL/PLATELET
Basophils Absolute: 0 K/uL (ref 0.0–0.1)
Basophils Relative: 0.6 % (ref 0.0–3.0)
Eosinophils Absolute: 0.3 K/uL (ref 0.0–0.7)
Eosinophils Relative: 3.7 % (ref 0.0–5.0)
HCT: 40.9 % (ref 39.0–52.0)
Hemoglobin: 13.9 g/dL (ref 13.0–17.0)
Lymphocytes Relative: 33.3 % (ref 12.0–46.0)
Lymphs Abs: 2.5 K/uL (ref 0.7–4.0)
MCHC: 34.1 g/dL (ref 30.0–36.0)
MCV: 86.4 fl (ref 78.0–100.0)
Monocytes Absolute: 0.6 K/uL (ref 0.1–1.0)
Monocytes Relative: 8.1 % (ref 3.0–12.0)
Neutro Abs: 4.2 K/uL (ref 1.4–7.7)
Neutrophils Relative %: 54.3 % (ref 43.0–77.0)
Platelets: 328 K/uL (ref 150.0–400.0)
RBC: 4.73 Mil/uL (ref 4.22–5.81)
RDW: 13.3 % (ref 11.5–15.5)
WBC: 7.6 K/uL (ref 4.0–10.5)

## 2023-09-29 MED ORDER — NA SULFATE-K SULFATE-MG SULF 17.5-3.13-1.6 GM/177ML PO SOLN
1.0000 | Freq: Once | ORAL | 0 refills | Status: AC
Start: 2023-09-29 — End: 2023-09-29

## 2023-09-29 MED ORDER — OMEPRAZOLE 20 MG PO CPDR: 20.0000 mg | DELAYED_RELEASE_CAPSULE | Freq: Two times a day (BID) | ORAL | 2 refills | Status: DC

## 2023-09-29 NOTE — Patient Instructions (Addendum)
 Constipation Recommend high fiber diet Drink plenty of water Samples of Linzess 72 mcg po daily , take 1 tablet 30-45 minutes before first meal of day with full glass of water  Dark stools, possible stomach ulcer GERD diet NO NSAIDs Will start you Omeprazole  1 tablet 30-45 mins before breakfast and dinner   Your provider has requested that you go to the basement level for lab work before leaving today. Press B on the elevator. The lab is located at the first door on the left as you exit the elevator.  Your provider has ordered Diatherix stool testing for you. You have received a kit from our office today containing all necessary supplies to complete this test. Please carefully read the stool collection instructions provided in the kit before opening the accompanying materials. In addition, be sure there is a label providing your full name and date of birth on the puritan opti-swab tube that is supplied in the kit (if you do not see a label with this information on your test tube, please make us  aware before test collection!). After completing the test, you should secure the purtian tube into the specimen biohazard bag. The [facility] E-Req sheet (including date and time of specimen collection) should be placed into the outside pocket of the specimen biohazard bag and returned to the [facility] lab (basement floor of [facility]) within 3 days of collection. Please make sure to give the specimen to a staff member at the lab. DO NOT leave the specimen on the counter.   If the specimen date and time (can be found in the upper right boxed portion of the sheet) are not filled out on the E-Req sheet, the test will NOT be performed.   You have been scheduled for an endoscopy and colonoscopy. Please follow the written instructions given to you at your visit today.  If you use inhalers (even only as needed), please bring them with you on the day of your procedure.  DO  NOT TAKE 7 DAYS PRIOR TO TEST- Trulicity (dulaglutide) Ozempic, Wegovy (semaglutide) Mounjaro (tirzepatide) Bydureon Bcise (exanatide extended release)  DO NOT TAKE 1 DAY PRIOR TO YOUR TEST Rybelsus (semaglutide) Adlyxin (lixisenatide) Victoza (liraglutide) Byetta (exanatide) ___________________________________________________________________________ Due to recent changes in healthcare laws, you may see the results of your imaging and laboratory studies on MyChart before your provider has had a chance to review them.  We understand that in some cases there may be results that are confusing or concerning to you. Not all laboratory results come back in the same time frame and the provider may be waiting for multiple results in order to interpret others.  Please give us  48 hours in order for your provider to thoroughly review all the results before contacting the office for clarification of your results.   _______________________________________________________  If your blood pressure at your visit was 140/90 or greater, please contact your primary care physician to follow up on this.  _______________________________________________________  If you are age 25 or older, your body mass index should be between 23-30. Your Body mass index is 27.57 kg/m. If this is out of the aforementioned range listed, please consider follow up with your Primary Care Provider.  If you are age 88 or younger, your body mass index should be between 19-25. Your Body mass index is 27.57 kg/m. If this is out of the aformentioned range listed, please consider follow up with your Primary Care Provider.   ________________________________________________________  The [facility] providers  would like to encourage you to use MYCHART to communicate with providers for non-urgent requests or questions.  Due to long hold times on the telephone, sending your provider a message by [facility] may be a faster and more efficient way to  get a response.  Please allow 48 business hours for a response.  Please remember that this is for non-urgent requests.  _______________________________________________________  [facility] is using a team-based approach to care.  Your team is made up of your doctor and two to three APPS. Our APPS (Nurse Practitioners and Physician Assistants) work with your physician to ensure care continuity for you. They are fully qualified to address your health concerns and develop a treatment plan. They communicate directly with your gastroenterologist to care for you. Seeing the Advanced Practice Practitioners on your physician's team can help you by facilitating care more promptly, often allowing for earlier appointments, access to diagnostic testing, procedures, and other specialty referrals.   Thank you for trusting me with your gastrointestinal care. Deanna May, FNP-C

## 2023-09-29 NOTE — Progress Notes (Signed)
 Agree with assessment and plan as outlined.

## 2023-10-20 ENCOUNTER — Encounter: Payer: Self-pay | Admitting: Gastroenterology

## 2023-10-23 ENCOUNTER — Other Ambulatory Visit: Payer: Self-pay | Admitting: Family Medicine

## 2023-11-23 ENCOUNTER — Ambulatory Visit (AMBULATORY_SURGERY_CENTER): Admitting: Gastroenterology

## 2023-11-23 ENCOUNTER — Encounter: Payer: Self-pay | Admitting: Gastroenterology

## 2023-11-23 VITALS — BP 131/76 | HR 67 | Temp 97.9°F | Resp 17 | Ht 72.0 in | Wt 203.0 lb

## 2023-11-23 DIAGNOSIS — K5730 Diverticulosis of large intestine without perforation or abscess without bleeding: Secondary | ICD-10-CM | POA: Diagnosis not present

## 2023-11-23 DIAGNOSIS — D123 Benign neoplasm of transverse colon: Secondary | ICD-10-CM

## 2023-11-23 DIAGNOSIS — R195 Other fecal abnormalities: Secondary | ICD-10-CM

## 2023-11-23 DIAGNOSIS — D125 Benign neoplasm of sigmoid colon: Secondary | ICD-10-CM

## 2023-11-23 DIAGNOSIS — Z8711 Personal history of peptic ulcer disease: Secondary | ICD-10-CM | POA: Diagnosis not present

## 2023-11-23 DIAGNOSIS — D120 Benign neoplasm of cecum: Secondary | ICD-10-CM

## 2023-11-23 DIAGNOSIS — D132 Benign neoplasm of duodenum: Secondary | ICD-10-CM

## 2023-11-23 DIAGNOSIS — D124 Benign neoplasm of descending colon: Secondary | ICD-10-CM | POA: Diagnosis not present

## 2023-11-23 DIAGNOSIS — K648 Other hemorrhoids: Secondary | ICD-10-CM

## 2023-11-23 DIAGNOSIS — Z860100 Personal history of colon polyps, unspecified: Secondary | ICD-10-CM

## 2023-11-23 DIAGNOSIS — K219 Gastro-esophageal reflux disease without esophagitis: Secondary | ICD-10-CM

## 2023-11-23 DIAGNOSIS — K5520 Angiodysplasia of colon without hemorrhage: Secondary | ICD-10-CM | POA: Diagnosis not present

## 2023-11-23 DIAGNOSIS — K449 Diaphragmatic hernia without obstruction or gangrene: Secondary | ICD-10-CM

## 2023-11-23 DIAGNOSIS — Z1211 Encounter for screening for malignant neoplasm of colon: Secondary | ICD-10-CM | POA: Diagnosis present

## 2023-11-23 MED ORDER — SODIUM CHLORIDE 0.9 % IV SOLN: 500.0000 mL | Freq: Once | INTRAVENOUS | Status: DC

## 2023-11-23 MED ORDER — LINACLOTIDE 72 MCG PO CAPS: 72.0000 ug | ORAL_CAPSULE | Freq: Every day | ORAL | 1 refills | Status: DC

## 2023-11-23 NOTE — Op Note (Signed)
 [facility] Patient Name: Lorrie Strauch Procedure Date: 11/23/2023 9:05 AM MRN: [id number] Endoscopist: Elspeth P. Leigh , MD, [id number] Age: 68 Referring MD:  Date of Birth: 09-29-1955 Gender: Male Account #: [id number] Procedure:                Colonoscopy Indications:              High risk colon cancer surveillance: Personal                            history of colonic polyps - colonoscopy last done                            reportedly > 20 years ago and polyps removed Medicines:                Monitored Anesthesia Care Procedure:                Pre-Anesthesia Assessment:                           - Prior to the procedure, a History and Physical                            was performed, and patient medications and                            allergies were reviewed. The patient's tolerance of                            previous anesthesia was also reviewed. The risks                            and benefits of the procedure and the sedation                            options and risks were discussed with the patient.                            All questions were answered, and informed consent                            was obtained. Prior Anticoagulants: The patient has                            taken no anticoagulant or antiplatelet agents. ASA                            Grade Assessment: II - A patient with mild systemic                            disease. After reviewing the risks and benefits,                            the patient was deemed in satisfactory condition to  undergo the procedure.                           After obtaining informed consent, the colonoscope                            was passed under direct vision. Throughout the                            procedure, the patient's blood pressure, pulse, and                            oxygen saturations were monitored continuously. The                            Olympus  Scope [id number] was introduced through the                            anus and advanced to the the cecum, identified by                            appendiceal orifice and ileocecal valve. The                            colonoscopy was performed without difficulty. The                            patient tolerated the procedure well. The quality                            of the bowel preparation was good. The ileocecal                            valve, appendiceal orifice, and rectum were                            photographed. Scope In: 9:32:29 AM Scope Out: 9:49:33 AM Scope Withdrawal Time: 0 hours 13 minutes 49 seconds  Total Procedure Duration: 0 hours 17 minutes 4 seconds  Findings:                 The perianal and digital rectal examinations were                            normal.                           A few small angiodysplastic lesions were found in                            the cecum.                           A 3 mm polyp was found in the cecum. The polyp was  sessile. The polyp was removed with a cold snare.                            Resection and retrieval were complete.                           A 2 to 3 mm polyp was found in the hepatic flexure.                            The polyp was flat. The polyp was removed with a                            cold snare. Resection and retrieval were complete.                           A 2 mm polyp was found in the transverse colon. The                            polyp was sessile. The polyp was removed with a                            cold snare. Resection and retrieval were complete.                           A 3 mm polyp was found in the descending colon. The                            polyp was sessile. The polyp was removed with a                            cold snare. Resection and retrieval were complete.                           A 3 to 4 mm polyp was found in the sigmoid colon.                             The polyp was sessile. The polyp was removed with a                            cold snare. Resection and retrieval were complete.                           Multiple small-mouthed diverticula were found in                            the transverse colon and left colon.                           Internal hemorrhoids were found during retroflexion.                           The exam was  otherwise without abnormality. Complications:            No immediate complications. Estimated blood loss:                            Minimal. Estimated Blood Loss:     Estimated blood loss was minimal. Impression:               - A few colonic angiodysplastic lesions.                           - One 3 mm polyp in the cecum, removed with a cold                            snare. Resected and retrieved.                           - One 2 to 3 mm polyp at the hepatic flexure,                            removed with a cold snare. Resected and retrieved.                           - One 2 mm polyp in the transverse colon, removed                            with a cold snare. Resected and retrieved.                           - One 3 mm polyp in the descending colon, removed                            with a cold snare. Resected and retrieved.                           - One 3 to 4 mm polyp in the sigmoid colon, removed                            with a cold snare. Resected and retrieved.                           - Diverticulosis in the transverse colon and in the                            left colon.                           - Internal hemorrhoids.                           - The examination was otherwise normal. Recommendation:           - Patient has a contact number available for  emergencies. The signs and symptoms of potential                            delayed complications were discussed with the                            patient. Return to normal activities tomorrow.                             Written discharge instructions were provided to the                            patient.                           - Resume previous diet.                           - Continue present medications.                           - Await pathology results. Elspeth P. Sharilyn Geisinger, MD 11/23/2023 9:55:32 AM This report has been signed electronically.

## 2023-11-23 NOTE — Progress Notes (Signed)
 To PACU via stretcher, sedated, good respiratory effort, VSS.

## 2023-11-23 NOTE — Op Note (Addendum)
  [facility] Patient Name: Calvin [name] Procedure Date: 11/23/2023 9:17 AM MRN: [id number] Endoscopist: Elspeth P. Leigh , MD, [id number] Age: 68 Referring MD:  Date of Birth: 06-Mar-1955 Gender: Male Account #: [id number] Procedure:                Upper GI endoscopy Indications:              reported dark stools - normal Hgb and H pylori                            stool test negative, on omeprazole  and dark stools                            mostly resolved. history of ulcers. Does have                            gastro-esophageal reflux disease which persists                            despite omeprazole  Medicines:                Monitored Anesthesia Care Procedure:                Pre-Anesthesia Assessment:                           - Prior to the procedure, a History and Physical                            was performed, and patient medications and                            allergies were reviewed. The patient's tolerance of                            previous anesthesia was also reviewed. The risks                            and benefits of the procedure and the sedation                            options and risks were discussed with the patient.                            All questions were answered, and informed consent                            was obtained. Prior Anticoagulants: The patient has                            taken no anticoagulant or antiplatelet agents. ASA                            Grade Assessment: II - A patient with mild systemic  disease. After reviewing the risks and benefits,                            the patient was deemed in satisfactory condition to                            undergo the procedure.                           After obtaining informed consent, the endoscope was                            passed under direct vision. Throughout the                            procedure, the patient's blood pressure,  pulse, and                            oxygen saturations were monitored continuously. The                            Olympus Scope [id number] was introduced through the                            mouth, and advanced to the second part of duodenum.                            The upper GI endoscopy was accomplished without                            difficulty. The patient tolerated the procedure                            well. Scope In: Scope Out: Findings:                 Esophagogastric landmarks were identified: the                            Z-line was found at 39 cm, the gastroesophageal                            junction was found at 39 cm and the upper extent of                            the gastric folds was found at 40 cm from the                            incisors.                           A 1 cm hiatal hernia was present.                           The exam of the esophagus was  otherwise normal.                           The entire examined stomach was normal. No ulcers.                            No biopsies taken for H pylori given recent                            negative stool test for this.                           A single 4 mm sessile polyp was found in the second                            portion of the duodenum. The polyp was removed with                            a cold snare. Resection and retrieval were complete.                           The exam of the duodenum was otherwise normal. Complications:            No immediate complications. Estimated blood loss:                            Minimal. Estimated Blood Loss:     Estimated blood loss was minimal. Impression:               - Esophagogastric landmarks identified.                           - 1 cm hiatal hernia.                           - Normal esophagus otherwise.                           - Normal stomach.                           - A single duodenal polyp. Resected and retrieved.                            - Normal duodenum otherwise. Recommendation:           - Patient has a contact number available for                            emergencies. The signs and symptoms of potential                            delayed complications were discussed with the                            patient. Return to normal activities tomorrow.  Written discharge instructions were provided to the                            patient.                           - Resume previous diet.                           - Continue present medications.                           - Upon speaking with patient / family after the                            procedure, he has not been taking omeprazole . Take                            20mg  omeprazole  BID, and if this does not help,                            trial of increasing omeprazole  to 40mg  BID for 1                            month if symptoms of GERD persist, long term use                            lowest dose needed to control symptoms                           - Await pathology results. Elspeth P. Sabrie Moritz, MD 11/23/2023 9:31:14 AM This report has been signed electronically.

## 2023-11-23 NOTE — Patient Instructions (Addendum)
 YOU HAD AN ENDOSCOPIC PROCEDURE TODAY AT THE [facility]:   Refer to the procedure report that was given to you for any specific questions about what was found during the examination.  If the procedure report does not answer your questions, please call your gastroenterologist to clarify.  If you requested that your care partner not be given the details of your procedure findings, then the procedure report has been included in a sealed envelope for you to review at your convenience later.  YOU SHOULD EXPECT: Some feelings of bloating in the abdomen. Passage of more gas than usual.  Walking can help get rid of the air that was put into your GI tract during the procedure and reduce the bloating. If you had a lower endoscopy (such as a colonoscopy or flexible sigmoidoscopy) you may notice spotting of blood in your stool or on the toilet paper. If you underwent a bowel prep for your procedure, you may not have a normal bowel movement for a few days.  Please Note:  You might notice some irritation and congestion in your nose or some drainage.  This is from the oxygen used during your procedure.  There is no need for concern and it should clear up in a day or so.  SYMPTOMS TO REPORT IMMEDIATELY:  Following lower endoscopy (colonoscopy or flexible sigmoidoscopy):  Excessive amounts of blood in the stool  Significant tenderness or worsening of abdominal pains  Swelling of the abdomen that is new, acute  Fever of 100F or higher  Following upper endoscopy (EGD)  Vomiting of blood or coffee ground material  New chest pain or pain under the shoulder blades  Painful or persistently difficult swallowing  New shortness of breath  Fever of 100F or higher  Black, tarry-looking stools  Resume previous diet Continue present medications   Trial of increasing omeprazole  to 40 mg twice a day for 1 month if symptoms of GERD persist long term use lowest dose needed to control symptoms Await pathology  results Handouts on diverticulosis, polyps, and hiatal hernia given  For urgent or emergent issues, a gastroenterologist can be reached at any hour by calling [phone]. Do not use MyChart messaging for urgent concerns.    DIET:  We do recommend a small meal at first, but then you may proceed to your regular diet.  Drink plenty of fluids but you should avoid alcoholic beverages for 24 hours.  ACTIVITY:  You should plan to take it easy for the rest of today and you should NOT DRIVE or use heavy machinery until tomorrow (because of the sedation medicines used during the test).    FOLLOW UP: Our staff will call the number listed on your records the next business day following your procedure.  We will call around 7:15- 8:00 am to check on you and address any questions or concerns that you may have regarding the information given to you following your procedure. If we do not reach you, we will leave a message.     If any biopsies were taken you will be contacted by phone or by letter within the next 1-3 weeks.  Please call us  at [phone] if you have not heard about the biopsies in 3 weeks.    SIGNATURES/CONFIDENTIALITY: You and/or your care partner have signed paperwork which will be entered into your electronic medical record.  These signatures attest to the fact that that the information above on your After Visit Summary has been reviewed and is understood.  Full responsibility of the confidentiality of this discharge information lies with you and/or your care-partner.

## 2023-11-23 NOTE — Progress Notes (Signed)
 Called to room to assist during endoscopic procedure.  Patient ID and intended procedure confirmed with present staff. Received instructions for my participation in the procedure from the performing physician.

## 2023-11-23 NOTE — Progress Notes (Signed)
 [facility] History and Physical   Primary Care Physician:  Lendia Boby CROME, NP-C   Reason for Procedure:   Dark stools / history of PUD, history of colon polyps  Plan:    EGD and colonoscopy     HPI: Calvin [name] is a 68 y.o. male  here for EGD and colonoscopy.  HIstory of PUD reportedly, had some dark stools when seen in the office, started on omeprazole . Hgb was normal, H pylori stool test negative. States not much dark stools since then but having GERD that is not well controlled despite taking omeprazole . EGD To further evaluate. Last colonoscopy more than 20 years ago, reportedly had polyps. Colonoscopy for surveillance.  He has constipation, did a double prep which he states worked well. No family history of colon cancer known. Otherwise feels well without any cardiopulmonary symptoms.   I have discussed risks / benefits of anesthesia and endoscopic procedure with [name] and they wish to proceed with the exams as outlined today.    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   Elevated PSA    GERD (gastroesophageal reflux disease)    Hypertension    Paraphimosis    Urinary retention     Past Surgical History:  Procedure Laterality Date   COLONOSCOPY     CYSTOSCOPY  06/14/2021   in office   HOLEP-LASER ENUCLEATION OF THE PROSTATE WITH MORCELLATION N/A 08/23/2021   Procedure: HOLEP-LASER ENUCLEATION OF THE PROSTATE WITH MORCELLATION;  Surgeon: Francisca Redell BROCKS, MD;  Location: [facility];  Service: Urology;  Laterality: N/A;   PROSTATE SURGERY  2023   UPPER GI ENDOSCOPY     WISDOM TOOTH EXTRACTION  1983    Prior to Admission medications   Medication Sig Start Date End Date Taking? Authorizing Provider  olmesartan -hydrochlorothiazide (BENICAR  HCT) 20-12.5 MG tablet TAKE 1 TABLET BY MOUTH DAILY 10/23/23  Yes Henson, Vickie L, NP-C  potassium chloride  (KLOR-CON  M) 10 MEQ tablet Take 1 tablet  (10 mEq total) by mouth daily. 08/27/23  Yes Henson, Vickie L, NP-C  omeprazole  (PRILOSEC) 20 MG capsule Take 1 capsule (20 mg total) by mouth 2 (two) times daily before a meal. 09/29/23   May, Deanna J, NP    Current Outpatient Medications  Medication Sig Dispense Refill   olmesartan -hydrochlorothiazide (BENICAR  HCT) 20-12.5 MG tablet TAKE 1 TABLET BY MOUTH DAILY 90 tablet 0   potassium chloride  (KLOR-CON  M) 10 MEQ tablet Take 1 tablet (10 mEq total) by mouth daily. 30 tablet 2   omeprazole  (PRILOSEC) 20 MG capsule Take 1 capsule (20 mg total) by mouth 2 (two) times daily before a meal. 60 capsule 2   Current Facility-Administered Medications  Medication Dose Route Frequency Provider Last Rate Last Admin   0.9 %  sodium chloride  infusion  500 mL Intravenous Once Hallelujah Wysong, Elspeth SQUIBB, MD        Allergies as of 11/23/2023 - Review Complete 11/23/2023  Allergen Reaction Noted   Penicillins Rash 02/01/2014    Family History  Problem Relation Age of Onset   Heart failure Mother    Colon cancer Neg Hx    Esophageal cancer Neg Hx    Rectal cancer Neg Hx    Stomach cancer Neg Hx     Social History   Socioeconomic History   Marital status: Single    Spouse name: Not on file   Number of children: 0   Years of education: Not on file  Highest education level: Not on file  Occupational History   Not on file  Tobacco Use   Smoking status: Never    Passive exposure: Never   Smokeless tobacco: Never  Vaping Use   Vaping status: Never Used  Substance and Sexual Activity   Alcohol use: No    Alcohol/week: 0.0 standard drinks of alcohol   Drug use: No   Sexual activity: Yes  Other Topics Concern   Not on file  Social History Narrative   Occupation employed , Radiographer, 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   Social Drivers of Health   Financial Resource Strain: Low Risk  (09/07/2023)   Overall Financial Resource Strain (CARDIA)    Difficulty of Paying Living Expenses: Not hard at all  Food Insecurity: No  Food Insecurity (09/07/2023)   Hunger Vital Sign    Worried About Running Out of Food in the Last Year: Never true    Ran Out of Food in the Last Year: Never true  Transportation Needs: No Transportation Needs (09/07/2023)   PRAPARE - Administrator, Civil Service (Medical): No    Lack of Transportation (Non-Medical): No  Physical Activity: Sufficiently Active (09/07/2023)   Exercise Vital Sign    Days of Exercise per Week: 5 days    Minutes of Exercise per Session: 120 min  Stress: No Stress Concern Present (09/07/2023)   [employer] of Occupational Health - Occupational Stress Questionnaire    Feeling of Stress: Not at all  Social Connections: Socially Isolated (09/07/2023)   Social Connection and Isolation Panel    Frequency of Communication with Friends and Family: More than three times a week    Frequency of Social Gatherings with Friends and Family: More than three times a week    Attends Religious Services: Never    Database administrator or Organizations: No    Attends Banker Meetings: Never    Marital Status: Never married  Intimate Partner Violence: Not At Risk (09/07/2023)   Humiliation, Afraid, Rape, and Kick questionnaire    Fear of Current or Ex-Partner: No    Emotionally Abused: No    Physically Abused: No    Sexually Abused: No    Review of Systems: All other review of systems negative except as mentioned in the HPI.  Physical Exam: Vital signs BP 134/79   Pulse 83   Temp 97.9 F (36.6 C)   Ht 6' (1.829 m)   Wt 203 lb (92.1 kg)   SpO2 98%   BMI 27.53 kg/m   General:   Alert,  Well-developed, pleasant and cooperative in NAD Lungs:  Clear throughout to auscultation.   Heart:  Regular rate and rhythm Abdomen:  Soft, nontender and nondistended.   Neuro/Psych:  Alert and cooperative. Normal mood and affect. A and O x 3  Marcey Naval, MD [facility]

## 2023-11-24 ENCOUNTER — Other Ambulatory Visit: Payer: Self-pay | Admitting: Gastroenterology

## 2023-11-24 ENCOUNTER — Telehealth: Payer: Self-pay | Admitting: *Deleted

## 2023-11-24 DIAGNOSIS — K921 Melena: Secondary | ICD-10-CM

## 2023-11-24 DIAGNOSIS — Z8711 Personal history of peptic ulcer disease: Secondary | ICD-10-CM

## 2023-11-24 NOTE — Telephone Encounter (Signed)
  Follow up Call-     11/23/2023    8:20 AM  Call back number  Post procedure Call Back phone  # [phone]  Permission to leave phone message Yes     Patient questions:  Do you have a fever, pain , or abdominal swelling? No. Pain Score  0 *  Have you tolerated food without any problems? Yes.    Have you been able to return to your normal activities? Yes.    Do you have any questions about your discharge instructions: Diet   No. Medications  No. Follow up visit  No.  Do you have questions or concerns about your Care? No.  Actions: * If pain score is 4 or above: No action needed, pain <4.

## 2023-11-25 ENCOUNTER — Ambulatory Visit: Payer: Self-pay | Admitting: Gastroenterology

## 2023-11-25 LAB — SURGICAL PATHOLOGY

## 2023-11-27 ENCOUNTER — Encounter: Payer: Self-pay | Admitting: Family Medicine

## 2023-11-27 ENCOUNTER — Ambulatory Visit: Payer: Self-pay | Admitting: Family Medicine

## 2023-11-27 ENCOUNTER — Ambulatory Visit: Admitting: Family Medicine

## 2023-11-27 VITALS — BP 118/84 | HR 60 | Temp 97.8°F | Ht 72.0 in | Wt 204.0 lb

## 2023-11-27 DIAGNOSIS — E663 Overweight: Secondary | ICD-10-CM | POA: Diagnosis not present

## 2023-11-27 DIAGNOSIS — I10 Essential (primary) hypertension: Secondary | ICD-10-CM | POA: Diagnosis not present

## 2023-11-27 DIAGNOSIS — E876 Hypokalemia: Secondary | ICD-10-CM | POA: Diagnosis not present

## 2023-11-27 DIAGNOSIS — K5900 Constipation, unspecified: Secondary | ICD-10-CM

## 2023-11-27 DIAGNOSIS — R7309 Other abnormal glucose: Secondary | ICD-10-CM | POA: Diagnosis not present

## 2023-11-27 LAB — HEMOGLOBIN A1C: Hgb A1c MFr Bld: 5.9 % (ref 4.6–6.5)

## 2023-11-27 LAB — BASIC METABOLIC PANEL WITH GFR
BUN: 13 mg/dL (ref 6–23)
CO2: 32 meq/L (ref 19–32)
Calcium: 9.2 mg/dL (ref 8.4–10.5)
Chloride: 104 meq/L (ref 96–112)
Creatinine, Ser: 1.01 mg/dL (ref 0.40–1.50)
GFR: 75.41 mL/min (ref >60.00)
Glucose, Bld: 111 mg/dL — ABNORMAL HIGH (ref 70–99)
Potassium: 3.8 meq/L (ref 3.5–5.1)
Sodium: 143 meq/L (ref 135–145)

## 2023-11-27 LAB — MAGNESIUM: Magnesium: 2.2 mg/dL (ref 1.5–2.5)

## 2023-11-27 LAB — TSH: TSH: 1.27 u[IU]/mL (ref 0.35–5.50)

## 2023-11-27 NOTE — Patient Instructions (Addendum)
 Please call [phone] to make an appointment with cardiology. You were referred in July.    Check with your pharmacy regarding Tdap vaccine and shingles vaccines.

## 2023-11-27 NOTE — Progress Notes (Signed)
 His labs are fine but he does have early prediabetes. Limit sugar and carbohydrates such as potatoes, bread, pasta and rice.

## 2023-11-27 NOTE — Progress Notes (Signed)
 Subjective:     Patient ID: Calvin [name], male    DOB: [birth year], 68 y.o.   MRN: [id number]  Chief Complaint  Patient presents with   Annual Exam    fasting    HPI  Discussed the use of AI scribe software for clinical note transcription with the patient, who gave verbal consent to proceed.  History of Present Illness Calvin [name] is a 68 year old male who presents for follow-up on chronic health conditions.  Colorectal neoplasia surveillance - Recent colonoscopy with removal of precancerous polyps and recall in 3 years  Hypertension management - Monitors blood pressure every three days - Most readings within normal range; some elevated - Recent blood pressure reading: 114/82 - Takes olmesartan  and hydrochlorothiazide for blood pressure control - Uses potassium supplements to counteract potassium loss from diuretic - No lightheadedness, dizziness, or exhaustion  Constipation - prescribed Linzess before breakfast for constipation. He has not yet started the medication.   Immunization status - Received influenza and COVID vaccines last month - Plans to check with pharmacy regarding tetanus and shingles vaccines     Health Maintenance Due  Topic Date Due   DTaP/Tdap/Td (1 - Tdap) Never done   Zoster Vaccines- Shingrix (1 of 2) Never done    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   Elevated PSA    GERD (gastroesophageal reflux disease)    Hypertension    Paraphimosis    Urinary retention     Past Surgical History:  Procedure Laterality Date   COLONOSCOPY     CYSTOSCOPY  06/14/2021   in office   HOLEP-LASER ENUCLEATION OF THE PROSTATE WITH MORCELLATION N/A 08/23/2021   Procedure: HOLEP-LASER ENUCLEATION OF THE PROSTATE WITH MORCELLATION;  Surgeon: Francisca Redell BROCKS, MD;  Location: [facility];  Service: Urology;  Laterality: N/A;   PROSTATE SURGERY  2023   UPPER GI ENDOSCOPY     WISDOM TOOTH  EXTRACTION  1983    Family History  Problem Relation Age of Onset   Heart failure Mother    Colon cancer Neg Hx    Esophageal cancer Neg Hx    Rectal cancer Neg Hx    Stomach cancer Neg Hx     Social History   Socioeconomic History   Marital status: Single    Spouse name: Not on file   Number of children: 0   Years of education: Not on file   Highest education level: Not on file  Occupational History   Not on file  Tobacco Use   Smoking status: Never    Passive exposure: Never   Smokeless tobacco: Never  Vaping Use   Vaping status: Never Used  Substance and Sexual Activity   Alcohol use: No    Alcohol/week: 0.0 standard drinks of alcohol   Drug use: No   Sexual activity: Yes  Other Topics Concern   Not on file  Social History Narrative   Occupation employed , Radiographer, 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   Social Drivers of Health   Financial Resource Strain: Low Risk  (09/07/2023)   Overall Financial Resource Strain (CARDIA)    Difficulty of Paying Living Expenses: Not hard at all  Food Insecurity: No Food Insecurity (09/07/2023)   Hunger Vital Sign    Worried About Running Out of Food in the Last Year: Never true    Ran Out of Food in the Last Year: Never true  Transportation Needs: No Transportation  Needs (09/07/2023)   PRAPARE - Administrator, Civil Service (Medical): No    Lack of Transportation (Non-Medical): No  Physical Activity: Sufficiently Active (09/07/2023)   Exercise Vital Sign    Days of Exercise per Week: 5 days    Minutes of Exercise per Session: 120 min  Stress: No Stress Concern Present (09/07/2023)   [employer] of Occupational Health - Occupational Stress Questionnaire    Feeling of Stress: Not at all  Social Connections: Socially Isolated (09/07/2023)   Social Connection and Isolation Panel    Frequency of Communication with Friends and Family: More than three times a week    Frequency of Social Gatherings with Friends and Family: More than three  times a week    Attends Religious Services: Never    Database administrator or Organizations: No    Attends Banker Meetings: Never    Marital Status: Never married  Intimate Partner Violence: Not At Risk (09/07/2023)   Humiliation, Afraid, Rape, and Kick questionnaire    Fear of Current or Ex-Partner: No    Emotionally Abused: No    Physically Abused: No    Sexually Abused: No    Outpatient Medications Prior to Visit  Medication Sig Dispense Refill   linaclotide (LINZESS) 72 MCG capsule Take 1 capsule (72 mcg total) by mouth daily before breakfast. 30 capsule 1   olmesartan -hydrochlorothiazide (BENICAR  HCT) 20-12.5 MG tablet TAKE 1 TABLET BY MOUTH DAILY 90 tablet 0   omeprazole  (PRILOSEC) 20 MG capsule TAKE 1 CAPSULE(20 MG) BY MOUTH TWICE DAILY BEFORE A MEAL 180 capsule 1   potassium chloride  (KLOR-CON  M) 10 MEQ tablet Take 1 tablet (10 mEq total) by mouth daily. 30 tablet 2   No facility-administered medications prior to visit.    Allergies  Allergen Reactions   Penicillins Rash    ROS Per HPI    Objective:    Physical Exam Constitutional:      General: He is not in acute distress.    Appearance: He is not ill-appearing.  HENT:     Mouth/Throat:     Mouth: Mucous membranes are moist.     Pharynx: Oropharynx is clear.  Eyes:     Extraocular Movements: Extraocular movements intact.     Conjunctiva/sclera: Conjunctivae normal.     Pupils: Pupils are equal, round, and reactive to light.  Cardiovascular:     Rate and Rhythm: Normal rate and regular rhythm.  Pulmonary:     Effort: Pulmonary effort is normal.     Breath sounds: Normal breath sounds.  Abdominal:     General: Bowel sounds are normal. There is no distension.     Palpations: Abdomen is soft.     Tenderness: There is no abdominal tenderness. There is no guarding or rebound.  Musculoskeletal:     Cervical back: Normal range of motion and neck supple.     Right lower leg: No edema.     Left  lower leg: No edema.  Skin:    General: Skin is warm and dry.  Neurological:     General: No focal deficit present.     Mental Status: He is alert and oriented to person, place, and time.     Cranial Nerves: No cranial nerve deficit.     Motor: No weakness.     Coordination: Coordination normal.     Gait: Gait normal.  Psychiatric:        Mood and Affect: Mood normal.  Behavior: Behavior normal.        Thought Content: Thought content normal.      BP 118/84   Pulse 60   Temp 97.8 F (36.6 C) (Temporal)   Ht 6' (1.829 m)   Wt 204 lb (92.5 kg)   SpO2 98%   BMI 27.67 kg/m  Wt Readings from Last 3 Encounters:  11/27/23 204 lb (92.5 kg)  11/23/23 203 lb (92.1 kg)  09/29/23 203 lb 4 oz (92.2 kg)       Assessment & Plan:   Problem List Items Addressed This Visit   None Visit Diagnoses       Primary hypertension    -  Primary     Overweight (BMI 25.0-29.9)           Assessment and Plan Assessment & Plan Essential hypertension Blood pressure readings are mostly within normal range with occasional elevations. Current medication regimen is effective. No symptoms of hypotension reported. - Continue current antihypertensive medication regimen. - Monitor blood pressure regularly. - Bring blood pressure machine to next appointment for calibration.  Constipation Linzess prescribed for constipation. Colonoscopy showed polyps but no other findings related to constipation. - Start Linzess as prescribed, take on an empty stomach to reduce diarrhea risk. - Monitor for side effects such as diarrhea and report to prescribing physician.   Hypokalemia, drug-induced Likely secondary to hydrochlorothiazide use causing potassium loss. - Check potassium levels during lab work.  Colonic polyps, post-polypectomy surveillance Recent colonoscopy revealed precancerous polyps, which were removed. - Schedule follow-up colonoscopy in three years.  Left ventricular hypertrophy,  referred to cardiology EKG showed left ventricular hypertrophy. Referral to cardiology made in July.  - Provide cardiology contact information to schedule appointment.  General Health Maintenance Due for tetanus and shingles vaccines. Recent flu and COVID vaccinations completed. Medicare covers tetanus and shingles vaccines at the pharmacy. - Check with pharmacy for tetanus and shingles vaccines.  Follow-Up Return for follow-up in three months. Lab work to include cholesterol and potassium levels. - Schedule follow-up appointment in three months. - Perform lab work including cholesterol and potassium levels.     I am having Calvin [name] maintain his potassium chloride , olmesartan -hydrochlorothiazide, linaclotide, and omeprazole .  No orders of the defined types were placed in this encounter.

## 2023-11-28 ENCOUNTER — Other Ambulatory Visit: Payer: Self-pay | Admitting: Family Medicine

## 2024-01-04 NOTE — Progress Notes (Unsigned)
 Cardiology Office Note:    Date:  01/05/2024   ID:  PLUMER MITTELSTAEDT, DOB 28-Oct-1955, MRN [id number]  PCP:  Lendia Boby CROME, NP-C  Cardiologist:  None  Electrophysiologist:  None   Referring MD: Lendia Boby CROME, NP-C   Chief Complaint  Patient presents with   Abnormal ECG    History of Present Illness:    Calvin [name] is a 68 y.o. male with a hx of hypertension, GERD, ?atrial fibrillation who is referred by Boby Lendia, NP for evaluation of abnormal EKG.  Denies any chest pain, dyspnea, lightheadedness, syncope, lower extremity edema, or palpitations.  He walks 1 to 2 hours 3-4 times per week.  Denies any exertional symptoms.  No smoking history.  He is adopted and does not know family history.     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   Elevated PSA    GERD (gastroesophageal reflux disease)    Hypertension    Paraphimosis    Urinary retention     Past Surgical History:  Procedure Laterality Date   COLONOSCOPY     CYSTOSCOPY  06/14/2021   in office   HOLEP-LASER ENUCLEATION OF THE PROSTATE WITH MORCELLATION N/A 08/23/2021   Procedure: HOLEP-LASER ENUCLEATION OF THE PROSTATE WITH MORCELLATION;  Surgeon: Francisca Redell BROCKS, MD;  Location: [facility] ORS;  Service: Urology;  Laterality: N/A;   PROSTATE SURGERY  2023   UPPER GI ENDOSCOPY     WISDOM TOOTH EXTRACTION  1983    Current Medications: Current Meds  Medication Sig   linaclotide  (LINZESS ) 72 MCG capsule Take 1 capsule (72 mcg total) by mouth daily before breakfast.   olmesartan -hydrochlorothiazide (BENICAR  HCT) 20-12.5 MG tablet TAKE 1 TABLET BY MOUTH DAILY   omeprazole  (PRILOSEC) 20 MG capsule TAKE 1 CAPSULE(20 MG) BY MOUTH TWICE DAILY BEFORE A MEAL   potassium chloride  (KLOR-CON  M) 10 MEQ tablet TAKE 1 TABLET(10 MEQ) BY MOUTH DAILY     Allergies:   Penicillins   Social History   Socioeconomic History   Marital status: Single    Spouse name:  Not on file   Number of children: 0   Years of education: Not on file   Highest education level: Not on file  Occupational History   Not on file  Tobacco Use   Smoking status: Never    Passive exposure: Never   Smokeless tobacco: Never  Vaping Use   Vaping status: Never Used  Substance and Sexual Activity   Alcohol use: No    Alcohol/week: 0.0 standard drinks of alcohol   Drug use: No   Sexual activity: Yes  Other Topics Concern   Not on file  Social History Narrative   Occupation employed , RADIOGRAPHER, THERAPEUTIC   Lives alone   Social Drivers of Health   Financial Resource Strain: Low Risk  (09/07/2023)   Overall Financial Resource Strain (CARDIA)    Difficulty of Paying Living Expenses: Not hard at all  Food Insecurity: No Food Insecurity (09/07/2023)   Hunger Vital Sign    Worried About Running Out of Food in the Last Year: Never true    Ran Out of Food in the Last Year: Never true  Transportation Needs: No Transportation Needs (09/07/2023)   PRAPARE - Administrator, Civil Service (Medical): No    Lack of Transportation (Non-Medical): No  Physical Activity: Sufficiently Active (09/07/2023)   Exercise Vital Sign    Days of Exercise per Week: 5 days  Minutes of Exercise per Session: 120 min  Stress: No Stress Concern Present (09/07/2023)   [employer] of Occupational Health - Occupational Stress Questionnaire    Feeling of Stress: Not at all  Social Connections: Socially Isolated (09/07/2023)   Social Connection and Isolation Panel    Frequency of Communication with Friends and Family: More than three times a week    Frequency of Social Gatherings with Friends and Family: More than three times a week    Attends Religious Services: Never    Database Administrator or Organizations: No    Attends Banker Meetings: Never    Marital Status: Never married     Family History: The patient's family history includes Heart failure in his mother. There is no  history of Colon cancer, Esophageal cancer, Rectal cancer, or Stomach cancer.  ROS:   Please see the history of present illness.     All other systems reviewed and are negative.  EKGs/Labs/Other Studies Reviewed:    The following studies were reviewed today:   EKG:   07/30/2023: Normal sinus rhythm, rate 71, LVH  Recent Labs: 09/29/2023: ALT 26; Hemoglobin 13.9; Platelets 328.0 11/27/2023: BUN 13; Creatinine, Ser 1.01; Magnesium 2.2; Potassium 3.8; Sodium 143; TSH 1.27  Recent Lipid Panel    Component Value Date/Time   CHOL 168 07/30/2023 1547   TRIG 132.0 07/30/2023 1547   HDL 62.40 07/30/2023 1547   CHOLHDL 3 07/30/2023 1547   VLDL 26.4 07/30/2023 1547   LDLCALC 79 07/30/2023 1547    Physical Exam:    VS:  BP 118/84   Pulse 78   Ht 6' 1 (1.854 m)   Wt 202 lb 6.4 oz (91.8 kg)   SpO2 99%   BMI 26.70 kg/m     Wt Readings from Last 3 Encounters:  01/05/24 202 lb 6.4 oz (91.8 kg)  11/27/23 204 lb (92.5 kg)  11/23/23 203 lb (92.1 kg)     GEN:  Well nourished, well developed in no acute distress HEENT: Normal NECK: No JVD; No carotid bruits LYMPHATICS: No lymphadenopathy CARDIAC: RRR, no murmurs, rubs, gallops RESPIRATORY:  Clear to auscultation without rales, wheezing or rhonchi  ABDOMEN: Soft, non-tender, non-distended MUSCULOSKELETAL:  No edema; No deformity  SKIN: Warm and dry NEUROLOGIC:  Alert and oriented x 3 PSYCHIATRIC:  Normal affect   ASSESSMENT:    1. Atrial fibrillation, unspecified type (HCC)   2. Abnormal EKG   3. Essential hypertension    PLAN:    ?Atrial fibrillation: Reported history of A-fib on chart but he is unaware of having this diagnosis.  I do not see any documented A-fib.  Will check Zio patch x 2 weeks  Abnormal EKG: LVH on EKG, will check echocardiogram  Hypertension: On olmesartan -HCTZ 20-12.5 mg daily.  Appears controlled  RTC in 6 months   Medication Adjustments/Labs and Tests Ordered: Current medicines are reviewed at  length with the patient today.  Concerns regarding medicines are outlined above.  Orders Placed This Encounter  Procedures   LONG TERM MONITOR (3-14 DAYS)   ECHOCARDIOGRAM COMPLETE   No orders of the defined types were placed in this encounter.   Patient Instructions  Medication Instructions:  Your physician recommends that you continue on your current medications as directed. Please refer to the Current Medication list given to you today.  *If you need a refill on your cardiac medications before your next appointment, please call your pharmacy*  Lab Work: None ordered  If you have any  lab test that is abnormal or we need to change your treatment, we will call you to review the results.  Testing/Procedures: Your physician has requested that you have an echocardiogram. Echocardiography is a painless test that uses sound waves to create images of your heart. It provides your doctor with information about the size and shape of your heart and how well your heart's chambers and valves are working. This procedure takes approximately one hour. There are no restrictions for this procedure. Please do NOT wear cologne, perfume, aftershave, or lotions (deodorant is allowed). Please arrive 15 minutes prior to your appointment time.  Please note: We ask at that you not bring children with you during ultrasound (echo/ vascular) testing. Due to room size and safety concerns, children are not allowed in the ultrasound rooms during exams. Our front office staff cannot provide observation of children in our lobby area while testing is being conducted. An adult accompanying a patient to their appointment will only be allowed in the ultrasound room at the discretion of the ultrasound technician under special circumstances. We apologize for any inconvenience.                            ZIO XT- Long Term Monitor Instructions  Your physician has requested you wear a ZIO patch monitor for 14 days.  This is a  single patch monitor. [employer] supplies one patch monitor per enrollment. Additional stickers are not available. Please do not apply patch if you will be having a Nuclear Stress Test,  Echocardiogram, Cardiac CT, MRI, or Chest Xray during the period you would be wearing the  monitor. The patch cannot be worn during these tests. You cannot remove and re-apply the  ZIO XT patch monitor.  Your ZIO patch monitor will be mailed 3 day USPS to your address on file. It may take 3-5 days  to receive your monitor after you have been enrolled.  Once you have received your monitor, please review the enclosed instructions. Your monitor  has already been registered assigning a specific monitor serial # to you.  Billing and Patient Assistance Program Information  We have supplied [employer] with any of your insurance information on file for billing purposes. [employer] offers a sliding scale Patient Assistance Program for patients that do not have  insurance, or whose insurance does not completely cover the cost of the ZIO monitor.  You must apply for the Patient Assistance Program to qualify for this discounted rate.  To apply, please call [employer] at [phone], select option 4, select option 2, ask to apply for  Patient Assistance Program. Meredeth will ask your household income, and how many people  are in your household. They will quote your out-of-pocket cost based on that information.  [employer] will also be able to set up a 75-month, interest-free payment plan if needed.  Applying the monitor   Shave hair from upper left chest.  Hold abrader disc by orange tab. Rub abrader in 40 strokes over the upper left chest as  indicated in your monitor instructions.  Clean area with 4 enclosed alcohol pads. Let dry.  Apply patch as indicated in monitor instructions. Patch will be placed under collarbone on left  side of chest with arrow pointing upward.  Rub patch adhesive wings for 2 minutes. Remove white label  marked 1. Remove the white  label marked 2. Rub patch adhesive wings for 2 additional minutes.  While looking in a mirror, press  and release button in center of patch. A small green light will  flash 3-4 times. This will be your only indicator that the monitor has been turned on.  Do not shower for the first 24 hours. You may shower after the first 24 hours.  Press the button if you feel a symptom. You will hear a small click. Record Date, Time and  Symptom in the Patient Logbook.  When you are ready to remove the patch, follow instructions on the last 2 pages of Patient  Logbook. Stick patch monitor onto the last page of Patient Logbook.  Place Patient Logbook in the blue and white box. Use locking tab on box and tape box closed  securely. The blue and white box has prepaid postage on it. Please place it in the mailbox as  soon as possible. Your physician should have your test results approximately 7 days after the  monitor has been mailed back to [facility].  Call [facility] Customer Care at [phone] if you have questions regarding  your ZIO XT patch monitor. Call them immediately if you see an orange light blinking on your  monitor.  If your monitor falls off in less than 4 days, contact our Monitor department at [phone].  If your monitor becomes loose or falls off after 4 days call [employer] at [phone] for  suggestions on securing your monitor    Follow-Up: At [facility], you and your health needs are our priority.  As part of our continuing mission to provide you with exceptional heart care, our providers are all part of one team.  This team includes your primary Cardiologist (physician) and Advanced Practice Providers or APPs (Physician Assistants and Nurse Practitioners) who all work together to provide you with the care you need, when you need it.  Your next appointment:   6 month(s)  Provider:   Dr. Kate   Thank you for choosing [facility]!!   [phone]        Signed, Lonni LITTIE Kate, MD  01/05/2024 9:08 AM    [facility]

## 2024-01-05 ENCOUNTER — Encounter: Payer: Self-pay | Admitting: Cardiology

## 2024-01-05 ENCOUNTER — Ambulatory Visit: Attending: Cardiology | Admitting: Cardiology

## 2024-01-05 ENCOUNTER — Ambulatory Visit

## 2024-01-05 VITALS — BP 118/84 | HR 78 | Ht 73.0 in | Wt 202.4 lb

## 2024-01-05 DIAGNOSIS — I10 Essential (primary) hypertension: Secondary | ICD-10-CM | POA: Diagnosis present

## 2024-01-05 DIAGNOSIS — R9431 Abnormal electrocardiogram [ECG] [EKG]: Secondary | ICD-10-CM | POA: Diagnosis not present

## 2024-01-05 DIAGNOSIS — I4891 Unspecified atrial fibrillation: Secondary | ICD-10-CM | POA: Insufficient documentation

## 2024-01-05 NOTE — Patient Instructions (Signed)
 Medication Instructions:  Your physician recommends that you continue on your current medications as directed. Please refer to the Current Medication list given to you today.  *If you need a refill on your cardiac medications before your next appointment, please call your pharmacy*  Lab Work: None ordered  If you have any lab test that is abnormal or we need to change your treatment, we will call you to review the results.  Testing/Procedures: Your physician has requested that you have an echocardiogram. Echocardiography is a painless test that uses sound waves to create images of your heart. It provides your doctor with information about the size and shape of your heart and how well your heart's chambers and valves are working. This procedure takes approximately one hour. There are no restrictions for this procedure. Please do NOT wear cologne, perfume, aftershave, or lotions (deodorant is allowed). Please arrive 15 minutes prior to your appointment time.  Please note: We ask at that you not bring children with you during ultrasound (echo/ vascular) testing. Due to room size and safety concerns, children are not allowed in the ultrasound rooms during exams. Our front office staff cannot provide observation of children in our lobby area while testing is being conducted. An adult accompanying a patient to their appointment will only be allowed in the ultrasound room at the discretion of the ultrasound technician under special circumstances. We apologize for any inconvenience.                            ZIO XT- Long Term Monitor Instructions  Your physician has requested you wear a ZIO patch monitor for 14 days.  This is a single patch monitor. [employer] supplies one patch monitor per enrollment. Additional stickers are not available. Please do not apply patch if you will be having a Nuclear Stress Test,  Echocardiogram, Cardiac CT, MRI, or Chest Xray during the period you would be wearing the   monitor. The patch cannot be worn during these tests. You cannot remove and re-apply the  ZIO XT patch monitor.  Your ZIO patch monitor will be mailed 3 day USPS to your address on file. It may take 3-5 days  to receive your monitor after you have been enrolled.  Once you have received your monitor, please review the enclosed instructions. Your monitor  has already been registered assigning a specific monitor serial # to you.  Billing and Patient Assistance Program Information  We have supplied [employer] with any of your insurance information on file for billing purposes. [employer] offers a sliding scale Patient Assistance Program for patients that do not have  insurance, or whose insurance does not completely cover the cost of the ZIO monitor.  You must apply for the Patient Assistance Program to qualify for this discounted rate.  To apply, please call [employer] at [phone], select option 4, select option 2, ask to apply for  Patient Assistance Program. Meredeth will ask your household income, and how many people  are in your household. They will quote your out-of-pocket cost based on that information.  [employer] will also be able to set up a 28-month, interest-free payment plan if needed.  Applying the monitor   Shave hair from upper left chest.  Hold abrader disc by orange tab. Rub abrader in 40 strokes over the upper left chest as  indicated in your monitor instructions.  Clean area with 4 enclosed alcohol pads. Let dry.  Apply patch as indicated in  monitor instructions. Patch will be placed under collarbone on left  side of chest with arrow pointing upward.  Rub patch adhesive wings for 2 minutes. Remove white label marked 1. Remove the white  label marked 2. Rub patch adhesive wings for 2 additional minutes.  While looking in a mirror, press and release button in center of patch. A small green light will  flash 3-4 times. This will be your only indicator that the monitor has been  turned on.  Do not shower for the first 24 hours. You may shower after the first 24 hours.  Press the button if you feel a symptom. You will hear a small click. Record Date, Time and  Symptom in the Patient Logbook.  When you are ready to remove the patch, follow instructions on the last 2 pages of Patient  Logbook. Stick patch monitor onto the last page of Patient Logbook.  Place Patient Logbook in the blue and white box. Use locking tab on box and tape box closed  securely. The blue and white box has prepaid postage on it. Please place it in the mailbox as  soon as possible. Your physician should have your test results approximately 7 days after the  monitor has been mailed back to [facility].  Call [facility] Customer Care at [phone] if you have questions regarding  your ZIO XT patch monitor. Call them immediately if you see an orange light blinking on your  monitor.  If your monitor falls off in less than 4 days, contact our Monitor department at [phone].  If your monitor becomes loose or falls off after 4 days call [employer] at [phone] for  suggestions on securing your monitor    Follow-Up: At [facility], you and your health needs are our priority.  As part of our continuing mission to provide you with exceptional heart care, our providers are all part of one team.  This team includes your primary Cardiologist (physician) and Advanced Practice Providers or APPs (Physician Assistants and Nurse Practitioners) who all work together to provide you with the care you need, when you need it.  Your next appointment:   6 month(s)  Provider:   Dr. Kate   Thank you for choosing [facility]!!   [phone]

## 2024-01-05 NOTE — Progress Notes (Unsigned)
 Enrolled for [employer] to mail a ZIO XT long term holter monitor to the patients address on file.

## 2024-01-26 ENCOUNTER — Other Ambulatory Visit: Payer: Self-pay

## 2024-01-26 MED ORDER — LINACLOTIDE 72 MCG PO CAPS: 72.0000 ug | ORAL_CAPSULE | Freq: Every day | ORAL | 3 refills | Status: DC

## 2024-01-27 ENCOUNTER — Other Ambulatory Visit: Payer: Self-pay | Admitting: Family Medicine

## 2024-01-29 ENCOUNTER — Other Ambulatory Visit: Payer: Self-pay

## 2024-01-29 MED ORDER — LINACLOTIDE 72 MCG PO CAPS: 72.0000 ug | ORAL_CAPSULE | Freq: Every day | ORAL | 3 refills | Status: AC

## 2024-02-12 ENCOUNTER — Ambulatory Visit (HOSPITAL_COMMUNITY)
Admission: RE | Admit: 2024-02-12 | Discharge: 2024-02-12 | Disposition: A | Discharge status: Home / Self Care | Source: Ambulatory Visit | Attending: Cardiology | Admitting: Cardiology

## 2024-02-12 DIAGNOSIS — R9431 Abnormal electrocardiogram [ECG] [EKG]: Secondary | ICD-10-CM

## 2024-02-12 LAB — ECHOCARDIOGRAM COMPLETE
Area-P 1/2: 2.96 cm2
S' Lateral: 3.6 cm

## 2024-02-14 ENCOUNTER — Ambulatory Visit: Payer: Self-pay | Admitting: Cardiology

## 2024-02-14 DIAGNOSIS — I4891 Unspecified atrial fibrillation: Secondary | ICD-10-CM

## 2024-02-14 DIAGNOSIS — I517 Cardiomegaly: Secondary | ICD-10-CM

## 2024-02-14 DIAGNOSIS — I10 Essential (primary) hypertension: Secondary | ICD-10-CM

## 2024-02-19 ENCOUNTER — Telehealth: Payer: Self-pay | Admitting: *Deleted

## 2024-02-19 NOTE — Telephone Encounter (Signed)
 Called and answered any questions that patient and sister had and what recommendations were given by provider. Understanding verbalized

## 2024-02-19 NOTE — Telephone Encounter (Signed)
 Patient's sister, is calling to speak the nurse about the patient's results. Please advise

## 2024-02-22 ENCOUNTER — Ambulatory Visit: Admitting: Gastroenterology

## 2024-02-24 ENCOUNTER — Other Ambulatory Visit: Payer: Self-pay | Admitting: Family Medicine

## 2024-03-01 ENCOUNTER — Other Ambulatory Visit: Payer: Self-pay | Admitting: Cardiology

## 2024-03-01 ENCOUNTER — Ambulatory Visit: Admitting: Family Medicine

## 2024-03-01 ENCOUNTER — Ambulatory Visit (HOSPITAL_COMMUNITY)
Admission: RE | Admit: 2024-03-01 | Discharge: 2024-03-01 | Disposition: A | Discharge status: Home / Self Care | Source: Ambulatory Visit | Attending: Cardiology | Admitting: Cardiology

## 2024-03-01 DIAGNOSIS — I517 Cardiomegaly: Secondary | ICD-10-CM

## 2024-03-01 DIAGNOSIS — I10 Essential (primary) hypertension: Secondary | ICD-10-CM | POA: Diagnosis not present

## 2024-03-01 MED ORDER — GADOBUTROL 1 MMOL/ML IV SOLN
10.0000 mL | Freq: Once | INTRAVENOUS | Status: AC | PRN
  Administered 2024-03-01: 10 mL via INTRAVENOUS

## 2024-03-02 ENCOUNTER — Ambulatory Visit: Payer: Self-pay | Admitting: Family Medicine

## 2024-03-02 ENCOUNTER — Ambulatory Visit: Admitting: Family Medicine

## 2024-03-02 ENCOUNTER — Encounter: Payer: Self-pay | Admitting: Family Medicine

## 2024-03-02 VITALS — BP 120/82 | HR 67 | Temp 97.8°F | Ht 73.0 in | Wt 202.0 lb

## 2024-03-02 DIAGNOSIS — N138 Other obstructive and reflux uropathy: Secondary | ICD-10-CM

## 2024-03-02 DIAGNOSIS — I2510 Atherosclerotic heart disease of native coronary artery without angina pectoris: Secondary | ICD-10-CM | POA: Diagnosis not present

## 2024-03-02 DIAGNOSIS — R7303 Prediabetes: Secondary | ICD-10-CM | POA: Diagnosis not present

## 2024-03-02 DIAGNOSIS — K219 Gastro-esophageal reflux disease without esophagitis: Secondary | ICD-10-CM | POA: Diagnosis not present

## 2024-03-02 DIAGNOSIS — K5900 Constipation, unspecified: Secondary | ICD-10-CM

## 2024-03-02 DIAGNOSIS — I10 Essential (primary) hypertension: Secondary | ICD-10-CM

## 2024-03-02 DIAGNOSIS — I517 Cardiomegaly: Secondary | ICD-10-CM | POA: Diagnosis not present

## 2024-03-02 DIAGNOSIS — N401 Enlarged prostate with lower urinary tract symptoms: Secondary | ICD-10-CM | POA: Diagnosis not present

## 2024-03-02 DIAGNOSIS — E876 Hypokalemia: Secondary | ICD-10-CM | POA: Diagnosis not present

## 2024-03-02 LAB — CBC WITH DIFFERENTIAL/PLATELET
Basophils Absolute: 0 K/uL (ref 0.0–0.1)
Basophils Relative: 0.7 % (ref 0.0–3.0)
Eosinophils Absolute: 0.2 K/uL (ref 0.0–0.7)
Eosinophils Relative: 3 % (ref 0.0–5.0)
HCT: 39.3 % (ref 39.0–52.0)
Hemoglobin: 13.5 g/dL (ref 13.0–17.0)
Lymphocytes Relative: 44.4 % (ref 12.0–46.0)
Lymphs Abs: 3.3 K/uL (ref 0.7–4.0)
MCHC: 34.5 g/dL (ref 30.0–36.0)
MCV: 85.4 fl (ref 78.0–100.0)
Monocytes Absolute: 0.6 K/uL (ref 0.1–1.0)
Monocytes Relative: 7.7 % (ref 3.0–12.0)
Neutro Abs: 3.2 K/uL (ref 1.4–7.7)
Neutrophils Relative %: 44.2 % (ref 43.0–77.0)
Platelets: 381 K/uL (ref 150.0–400.0)
RBC: 4.59 Mil/uL (ref 4.22–5.81)
RDW: 13.1 % (ref 11.5–15.5)
WBC: 7.3 K/uL (ref 4.0–10.5)

## 2024-03-02 LAB — LIPID PANEL
Cholesterol: 161 mg/dL (ref 28–200)
HDL: 56.2 mg/dL
LDL Cholesterol: 85 mg/dL (ref 10–99)
NonHDL: 105.25
Total CHOL/HDL Ratio: 3
Triglycerides: 103 mg/dL (ref 10.0–149.0)
VLDL: 20.6 mg/dL (ref 0.0–40.0)

## 2024-03-02 LAB — COMPREHENSIVE METABOLIC PANEL WITH GFR
ALT: 23 U/L (ref 3–53)
AST: 19 U/L (ref 5–37)
Albumin: 4.2 g/dL (ref 3.5–5.2)
Alkaline Phosphatase: 38 U/L — ABNORMAL LOW (ref 39–117)
BUN: 15 mg/dL (ref 6–23)
CO2: 32 meq/L (ref 19–32)
Calcium: 9.7 mg/dL (ref 8.4–10.5)
Chloride: 104 meq/L (ref 96–112)
Creatinine, Ser: 0.99 mg/dL (ref 0.40–1.50)
GFR: 77.1 mL/min
Glucose, Bld: 100 mg/dL — ABNORMAL HIGH (ref 70–99)
Potassium: 3.9 meq/L (ref 3.5–5.1)
Sodium: 142 meq/L (ref 135–145)
Total Bilirubin: 0.4 mg/dL (ref 0.2–1.2)
Total Protein: 7.4 g/dL (ref 6.0–8.3)

## 2024-03-02 LAB — HEMOGLOBIN A1C: Hgb A1c MFr Bld: 6.2 % (ref 4.6–6.5)

## 2024-03-02 MED ORDER — ATORVASTATIN CALCIUM 10 MG PO TABS: 10.0000 mg | ORAL_TABLET | Freq: Every day | ORAL | 1 refills | Status: AC

## 2024-03-02 NOTE — Progress Notes (Signed)
 I sent a statin to his pharmacy due to elevated risk for heart attack and stroke (ASCVD 16.1%). He should follow up with me in 8 weeks fasting.

## 2024-03-02 NOTE — Patient Instructions (Signed)
 Please go downstairs for labs before you leave.   I will be in touch with your results and recommendations.   If we need to start you on a statin for your cholesterol, I will schedule you back in 2 months.

## 2024-03-02 NOTE — Progress Notes (Signed)
 "  Subjective:     Patient ID: Calvin [name], male    DOB: Dec 30, 1955, 69 y.o.   MRN: [id number]  Chief Complaint  Patient presents with   Medical Management of Chronic Issues    3 month f/u    HPI  Discussed the use of AI scribe software for clinical note transcription with the patient, who gave verbal consent to proceed.  History of Present Illness Calvin [name] is a 69 year old male with prediabetes and hypertension who presents for follow-up of chronic health conditions.  Hypertension - Blood pressure previously 186 mmHg, currently 120/82 mmHg on daily olmesartan  and hydrochlorothiazide, started with this provider. - Awaiting results of recent cardiac MRI done per cardiology  - No chest pain, shortness of breath, or palpitations.  Prediabetes - A1c from October consistent with prediabetes. - Limits intake of sugary foods   Gastrointestinal symptoms - Takes omeprazole  twice daily with meals for acid reflux. - Uses Linzess  regularly for constipation. - Gastroenterology follow-up scheduled.  Urinary incontinence - History of elevated prostate labs and prostate surgery two years ago. - Persistent urinary leakage since surgery. - Plans to follow up with urology after other issues are addressed.     There are no preventive care reminders to display for this patient.  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   Elevated PSA    GERD (gastroesophageal reflux disease)    Hypertension    Paraphimosis    Urinary retention     Past Surgical History:  Procedure Laterality Date   COLONOSCOPY     CYSTOSCOPY  06/14/2021   in office   HOLEP-LASER ENUCLEATION OF THE PROSTATE WITH MORCELLATION N/A 08/23/2021   Procedure: HOLEP-LASER ENUCLEATION OF THE PROSTATE WITH MORCELLATION;  Surgeon: Francisca Redell BROCKS, MD;  Location: [facility];  Service: Urology;  Laterality: N/A;   PROSTATE SURGERY  2023   UPPER GI  ENDOSCOPY     WISDOM TOOTH EXTRACTION  1983    Family History  Problem Relation Age of Onset   Heart failure Mother    Colon cancer Neg Hx    Esophageal cancer Neg Hx    Rectal cancer Neg Hx    Stomach cancer Neg Hx     Social History   Socioeconomic History   Marital status: Single    Spouse name: Not on file   Number of children: 0   Years of education: Not on file   Highest education level: Not on file  Occupational History   Not on file  Tobacco Use   Smoking status: Never    Passive exposure: Never   Smokeless tobacco: Never  Vaping Use   Vaping status: Never Used  Substance and Sexual Activity   Alcohol use: No    Alcohol/week: 0.0 standard drinks of alcohol   Drug use: No   Sexual activity: Yes  Other Topics Concern   Not on file  Social History Narrative   Occupation employed , RADIOGRAPHER, THER[employer]   Lives alone   Social Drivers of Health   Tobacco Use: Low Risk (03/02/2024)   Patient History    Smoking Tobacco Use: Never    Smokeless Tobacco Use: Never    Passive Exposure: Never  Financial Resource Strain: Low Risk (09/07/2023)   Overall Financial Resource Strain (CARDIA)    Difficulty of Paying Living Expenses: Not hard at all  Food Insecurity: No Food Insecurity (09/07/2023)   Epic    Worried About  Running Out of Food in the Last Year: Never true    Ran Out of Food in the Last Year: Never true  Transportation Needs: No Transportation Needs (09/07/2023)   Epic    Lack of Transportation (Medical): No    Lack of Transportation (Non-Medical): No  Physical Activity: Sufficiently Active (09/07/2023)   Exercise Vital Sign    Days of Exercise per Week: 5 days    Minutes of Exercise per Session: 120 min  Stress: No Stress Concern Present (09/07/2023)   [employer] of Occupational Health - Occupational Stress Questionnaire    Feeling of Stress: Not at all  Social Connections: Socially Isolated (09/07/2023)   Social Connection and Isolation Panel    Frequency of  Communication with Friends and Family: More than three times a week    Frequency of Social Gatherings with Friends and Family: More than three times a week    Attends Religious Services: Never    Database Administrator or Organizations: No    Attends Banker Meetings: Never    Marital Status: Never married  Intimate Partner Violence: Not At Risk (09/07/2023)   Epic    Fear of Current or Ex-Partner: No    Emotionally Abused: No    Physically Abused: No    Sexually Abused: No  Depression (PHQ2-9): Low Risk (03/02/2024)   Depression (PHQ2-9)    PHQ-2 Score: 0  Alcohol Screen: Low Risk (09/07/2023)   Alcohol Screen    Last Alcohol Screening Score (AUDIT): 0  Housing: Unknown (09/07/2023)   Epic    Unable to Pay for Housing in the Last Year: No    Number of Times Moved in the Last Year: Not on file    Homeless in the Last Year: No  Utilities: Not At Risk (09/07/2023)   Epic    Threatened with loss of utilities: No  Health Literacy: Adequate Health Literacy (09/07/2023)   B1300 Health Literacy    Frequency of need for help with medical instructions: Never    Outpatient Medications Prior to Visit  Medication Sig Dispense Refill   linaclotide  (LINZESS ) 72 MCG capsule Take 1 capsule (72 mcg total) by mouth daily before breakfast. 30 capsule 3   olmesartan -hydrochlorothiazide (BENICAR  HCT) 20-12.5 MG tablet TAKE 1 TABLET BY MOUTH DAILY 90 tablet 0   omeprazole  (PRILOSEC) 20 MG capsule TAKE 1 CAPSULE(20 MG) BY MOUTH TWICE DAILY BEFORE A MEAL 180 capsule 1   potassium chloride  (KLOR-CON ) 10 MEQ tablet TAKE 1 TABLET(10 MEQ) BY MOUTH DAILY. Please schedule follow up for refills 30 tablet 0   No facility-administered medications prior to visit.    Allergies[1]  Review of Systems  Constitutional:  Negative for chills, fever, malaise/fatigue and weight loss.  Respiratory:  Negative for shortness of breath.   Cardiovascular:  Negative for chest pain, palpitations and leg swelling.   Gastrointestinal:  Positive for constipation. Negative for abdominal pain, diarrhea, nausea and vomiting.  Genitourinary:  Negative for dysuria, frequency and urgency.       Leakage  Neurological:  Negative for dizziness, focal weakness and headaches.  Psychiatric/Behavioral:  Negative for depression. The patient is not nervous/anxious.        Objective:    Physical Exam Constitutional:      General: He is not in acute distress.    Appearance: He is not ill-appearing.  Eyes:     Extraocular Movements: Extraocular movements intact.     Conjunctiva/sclera: Conjunctivae normal.  Cardiovascular:     Rate and  Rhythm: Normal rate and regular rhythm.  Pulmonary:     Effort: Pulmonary effort is normal.     Breath sounds: Normal breath sounds.  Musculoskeletal:     Cervical back: Normal range of motion and neck supple.     Right lower leg: No edema.     Left lower leg: No edema.  Skin:    General: Skin is warm and dry.  Neurological:     General: No focal deficit present.     Mental Status: He is alert and oriented to person, place, and time.     Cranial Nerves: No cranial nerve deficit.     Motor: No weakness.     Coordination: Coordination normal.     Gait: Gait normal.  Psychiatric:        Mood and Affect: Mood normal.        Behavior: Behavior normal.        Thought Content: Thought content normal.      BP 120/82   Pulse 67   Temp 97.8 F (36.6 C) (Temporal)   Ht 6' 1 (1.854 m)   Wt 202 lb (91.6 kg)   SpO2 99%   BMI 26.65 kg/m  Wt Readings from Last 3 Encounters:  03/02/24 202 lb (91.6 kg)  01/05/24 202 lb 6.4 oz (91.8 kg)  11/27/23 204 lb (92.5 kg)       Assessment & Plan:   Problem List Items Addressed This Visit     Constipation   LVH (left ventricular hypertrophy)   Other Visit Diagnoses       Primary hypertension    -  Primary   Relevant Orders   CBC with Differential/Platelet (Completed)   Comprehensive metabolic panel with GFR (Completed)      Prediabetes       Relevant Orders   CBC with Differential/Platelet (Completed)   Comprehensive metabolic panel with GFR (Completed)   Hemoglobin A1c (Completed)     Hypokalemia       Relevant Orders   Comprehensive metabolic panel with GFR (Completed)     ASCVD (arteriosclerotic cardiovascular disease)       Relevant Orders   Lipid panel (Completed)     BPH with obstruction/lower urinary tract symptoms         Gastroesophageal reflux disease, unspecified whether esophagitis present           Assessment and Plan Assessment & Plan Primary hypertension Blood pressure is well-controlled with current medication regimen. Previous readings were elevated, but current reading is 120/82 mmHg. - Continue olmesartan  and hydrochlorothiazide. - low sodium diet - monitor BP at home   Left ventricular hypertrophy Awaiting results from recent cardiac MRI. Previous abnormal EKG noted. Blood pressure control is crucial for management. - Await results from cardiac MRI. - Continue current blood pressure management.  Prediabetes Hemoglobin A1c in October indicated early prediabetes. He has been mindful of sugar intake - Ordered hemoglobin A1c test. - Advised continued dietary modifications to manage blood sugar levels.  Constipation Managed with linaclotide  (Linzess ). - Continue linaclotide  for constipation management.  Hypokalemia Potassium supplementation is ongoing. Recent refill of potassium medication noted. - Continue potassium supplementation.  Gastroesophageal reflux disease He reports taking omeprazole  twice daily as per package instructions. - Continue omeprazole  for GERD management per GI.  Benign prostatic hyperplasia with lower urinary tract symptoms He has undergone surgery for BPH but continues to experience urinary symptoms. Plans to follow up with urologist after current health issues are addressed. - Advised follow-up with  urologist for ongoing urinary  symptoms.  General health maintenance He engages in regular exercise and has made significant lifestyle changes, including quitting smoking and alcohol. Cholesterol levels were previously normal, and he is not on medication for hyperlipidemia. - Ordered blood work to check cholesterol levels. - Encouraged continued healthy lifestyle choices.  The 10-year ASCVD risk score (Arnett DK, et al., 2019) is: 16.1%   Values used to calculate the score:     Age: 40 years     Clinically relevant sex: Male     Is Non-Hispanic African American: No     Diabetic: No     Tobacco smoker: No     Systolic Blood Pressure: 120 mmHg     Is BP treated: Yes     HDL Cholesterol: 56.2 mg/dL     Total Cholesterol: 161 mg/dL    Start low dose atorvastatin  due to ASCVD      I am having Calvin [name] maintain his omeprazole , olmesartan -hydrochlorothiazide, linaclotide , and potassium chloride .  No orders of the defined types were placed in this encounter.      [1]  Allergies Allergen Reactions   Penicillins Rash   "

## 2024-03-07 ENCOUNTER — Ambulatory Visit: Payer: Self-pay | Admitting: Cardiology

## 2024-03-08 DIAGNOSIS — I4891 Unspecified atrial fibrillation: Secondary | ICD-10-CM

## 2024-03-10 NOTE — Telephone Encounter (Signed)
 Sister Blanchard) wants a call back regarding patient's MRI results

## 2024-03-11 NOTE — Telephone Encounter (Signed)
 Pt's sister called to follow up

## 2024-03-22 ENCOUNTER — Ambulatory Visit: Admitting: Gastroenterology

## 2024-05-05 ENCOUNTER — Ambulatory Visit: Admitting: Family Medicine

## 2024-06-14 ENCOUNTER — Ambulatory Visit: Admitting: Cardiology

## 2024-09-07 ENCOUNTER — Ambulatory Visit
